# Patient Record
Sex: Male | Born: 1980
Health system: Southern US, Community
[De-identification: ages and names within clinical notes are randomized; demographics above are authoritative.]

## PROBLEM LIST (undated history)

## (undated) DIAGNOSIS — D4989 Neoplasm of unspecified behavior of other specified sites: Principal | ICD-10-CM

## (undated) DIAGNOSIS — C8590 Non-Hodgkin lymphoma, unspecified, unspecified site: Secondary | ICD-10-CM

## (undated) DIAGNOSIS — R0600 Dyspnea, unspecified: Secondary | ICD-10-CM

## (undated) DIAGNOSIS — J984 Other disorders of lung: Secondary | ICD-10-CM

## (undated) DIAGNOSIS — Z86718 Personal history of other venous thrombosis and embolism: Secondary | ICD-10-CM

## (undated) DIAGNOSIS — J189 Pneumonia, unspecified organism: Secondary | ICD-10-CM

## (undated) DIAGNOSIS — C8330 Diffuse large B-cell lymphoma, unspecified site: Secondary | ICD-10-CM

## (undated) DIAGNOSIS — I82409 Acute embolism and thrombosis of unspecified deep veins of unspecified lower extremity: Secondary | ICD-10-CM

## (undated) DIAGNOSIS — T7840XA Allergy, unspecified, initial encounter: Secondary | ICD-10-CM

## (undated) DIAGNOSIS — C801 Malignant (primary) neoplasm, unspecified: Secondary | ICD-10-CM

## (undated) HISTORY — DX: Pneumonia, unspecified organism: J18.9

## (undated) HISTORY — PX: WISDOM TOOTH EXTRACTION: SHX21

## (undated) HISTORY — DX: Diffuse large B-cell lymphoma, unspecified site: C83.30

## (undated) HISTORY — PX: TYMPANIC MEMBRANE REPAIR: SHX294

## (undated) HISTORY — PX: OTHER SURGICAL HISTORY: SHX169

## (undated) HISTORY — DX: Dyspnea, unspecified: R06.00

## (undated) HISTORY — DX: Other disorders of lung: J98.4

## (undated) HISTORY — DX: Malignant (primary) neoplasm, unspecified: C80.1

## (undated) HISTORY — DX: Acute embolism and thrombosis of unspecified deep veins of unspecified lower extremity: I82.409

## (undated) HISTORY — DX: Neoplasm of unspecified behavior of other specified sites: D49.89

## (undated) HISTORY — DX: Non-Hodgkin lymphoma, unspecified, unspecified site: C85.90

## (undated) HISTORY — DX: Personal history of other venous thrombosis and embolism: Z86.718

---

## 2008-05-12 ENCOUNTER — Emergency Department (HOSPITAL_COMMUNITY): Admission: EM | Admit: 2008-05-12 | Discharge: 2008-05-12 | Payer: Self-pay | Admitting: Emergency Medicine

## 2008-05-13 ENCOUNTER — Ambulatory Visit (HOSPITAL_COMMUNITY): Admission: RE | Admit: 2008-05-13 | Discharge: 2008-05-13 | Payer: Self-pay | Admitting: Pulmonary Disease

## 2008-05-15 ENCOUNTER — Ambulatory Visit: Payer: Self-pay | Admitting: Cardiology

## 2008-05-16 ENCOUNTER — Emergency Department (HOSPITAL_COMMUNITY): Admission: EM | Admit: 2008-05-16 | Discharge: 2008-05-17 | Payer: Self-pay | Admitting: Emergency Medicine

## 2008-05-16 ENCOUNTER — Ambulatory Visit (HOSPITAL_COMMUNITY): Admission: RE | Admit: 2008-05-16 | Discharge: 2008-05-16 | Payer: Self-pay | Admitting: Pulmonary Disease

## 2008-05-31 ENCOUNTER — Ambulatory Visit: Payer: Self-pay | Admitting: Cardiology

## 2008-10-15 DIAGNOSIS — R0600 Dyspnea, unspecified: Secondary | ICD-10-CM | POA: Insufficient documentation

## 2008-10-15 DIAGNOSIS — R002 Palpitations: Secondary | ICD-10-CM | POA: Insufficient documentation

## 2008-10-15 DIAGNOSIS — R0789 Other chest pain: Secondary | ICD-10-CM | POA: Insufficient documentation

## 2008-10-15 DIAGNOSIS — R0602 Shortness of breath: Secondary | ICD-10-CM | POA: Insufficient documentation

## 2010-06-18 LAB — POCT CARDIAC MARKERS
CKMB, poc: <1 ng/mL — ABNORMAL LOW (ref 1.0–8.0)
CKMB, poc: <1 ng/mL — ABNORMAL LOW (ref 1.0–8.0)
Myoglobin, poc: 51.1 ng/mL (ref 12–200)
Myoglobin, poc: 60.7 ng/mL (ref 12–200)
Troponin i, poc: <0.05 ng/mL (ref 0.00–0.09)
Troponin i, poc: <0.05 ng/mL (ref 0.00–0.09)

## 2010-07-21 NOTE — Procedures (Signed)
NAME:  [name], CODE                ACCOUNT NO.:  [id number]   MEDICAL RECORD NO.:  [id number]          PATIENT TYPE:  OUT   LOCATION:  RESP                          FACILITY:  [facility]   PHYSICIAN:  Edward L. Juanetta Gosling, M.D.DATE OF BIRTH:  Oct 18, 1980   DATE OF PROCEDURE:  DATE OF DISCHARGE:                            PULMONARY FUNCTION TEST   1. Spirometry is normal.  2. Lung volumes are normal.  3. DLCO was normal.      Edward L. Juanetta Gosling, M.D.  Electronically Signed     ELH/MEDQ  D:  05/18/2008  T:  05/18/2008  Job:  [id number]

## 2010-07-21 NOTE — Letter (Signed)
May 15, 2008    Edward L. Juanetta Gosling, MD  [address]  [place], [place] [address]   RE:  [name], [name]  MRN:  [id number]  /  DOB:  08-25-1980   Dr. Renae Fickle:   It was my pleasure evaluating [name] in the office today in  consultation at your request for episodic chest discomfort, dyspnea, and  palpitations.  As you know, this nice young gentleman has enjoyed  excellent health.  He has no known risk factors for cardiovascular  disease, certainly not for premature disease.  He has been active and  enjoyed sports without any symptoms or difficulty.  He recently had  drainage from his ear and sinus congestion, prompting him to take a  course of azithromycin and decongestant.  He subsequently had pain in  his ear, prompting a second course of antibiotics, at this time with  Levaquin.  The night that he developed symptoms, he had been watching  basketball game, drinking a few beers, and took Advil, in addition to  his Levaquin.  He subsequently awoke with a sensation of tachy  palpitations.  He walked to the bathroom and experienced some  lightheadedness, but no falling and no syncope.  He had a dry mouth and  drink considerable amount of water.  He returned to bed, but experienced  some dyspnea.  This does not sound like true air hunger, but rather a  feeling that he is not taking a deep breath or not able to take a full  breath.  He walked around some at home ,but had continued symptoms as  well as some transient chest heaviness prompting him to seek evaluation  in the emergency department.  A chest x-ray was unremarkable.  Cardiac  markers were unremarkable.  EKG was reportedly unremarkable.  Those  records were reviewed.  The impression was adverse drug reaction, and no  treatment was offered.  He subsequently was seen in your office the  following day at which time there were no new findings.  It was felt  that he did not require any additional medication for his presumed  otitis.  That  night, he had a second spell similar to that described  initially.  Subsequent to that, he has felt somewhat washed out and has  had discontinuing sensation of respiratory difficulty.  He has no  further sinus congestion.  He has had no recent drainage from his ear.  He has had no fever nor chills.   He denies any drugs or over-the-counter preparations.  He had used a  protein preparation in the past, but not within recent days.  He has not  had any recent travel nor any recent immobility for any period of time.   He takes no medications routinely.   He has no history of drug allergies.   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  Exercises regularly.   FAMILY HISTORY:  Father, mother and 2 siblings are alive and well.  His  grandfather does have cardiac disease.   REVIEW OF SYSTEMS:  Notable for stable weight, appetite, and a normal  diet.  All other systems reviewed and are negative.   PHYSICAL EXAMINATION:  GENERAL:  Well-developed gentleman in no acute  distress.  VITAL SIGNS:  The weight is 198.  Blood pressure 125/75, heart rate is  90 and regular, respirations 13, and unlabored.  HEENT:  Anicteric sclerae; EOMs full; normal oral mucosa.  NECK:  No jugular venous distention; no carotid bruits.  ENDOCRINE:  No thyromegaly.  HEMATOPOIETIC:  No adenopathy.  SKIN:  No significant lesions.  CARDIAC:  Normal first and second heart sounds.  LUNGS:  Clear with full expansion; resonant to percussion.  ABDOMEN:  Soft and nontender; no organomegaly.  EXTREMITIES:  No edema; normal distal pulses.  NEUROLOGIC:  Symmetric strength and tone; normal cranial nerves.   A 6-minute walk test was performed.  The patient covered 1400 feet  without difficulty or symptoms; oxygen saturation remained greater than  or equal to 96% throughout.   IMPRESSION:  [name] has predominately palpitations with some  associated disorder  breathing, chest discomfort, lightheadedness, and an  episode of emesis during the initial spell.  This does not lead me to  any specific diagnosis.  Certainly, basic blood work is necessary  including a TSH level, chemistry profile, and CBC.  A D-dimer and BMP  level will also be obtained.  He does not need another chest x-ray.  His  EKG was reviewed and shows a right bundle-branch block without any other  abnormalities.  This is likely benign and does not reflect  underlying cardiac disease.  If he experiences any additional episodes  of palpitations, he will be provided with an event recorder.  If there  are any other suggestions of abnormality, further testing including  stress testing and an echocardiogram will be necessary.  I will plan to  reevaluate this nice gentleman in 1-2 weeks.    Sincerely,      Gerrit Friends. Dietrich Pates, MD, [facility]  Electronically Signed    RMR/MedQ  DD: 05/15/2008  DT: 05/16/2008  Job #: [id number]

## 2010-07-21 NOTE — Letter (Signed)
May 31, 2008    Edward L. Juanetta Gosling, MD  [address]  [place], [place] [address]   RE:  DELMON, ANDRADA  MRN:  [id number]  /  DOB:  January 26, 1981   Dear Renae Fickle,   Mr. Cannata returns to the office for continued assessment and treatment  of chest discomfort, dyspnea, and palpitations.  As you know, he was  seen in the emergency department approximately 2 weeks ago with a  recurrence of these symptoms.  No specific diagnosis was established.  He has had a normal CBC, normal chemistry profile, normal lipid profile,  and normal TSH level.  His BMP and D-dimer were also normal.  Chest x-  ray and pulmonary function tests were normal.  He was given a  prescription for atenolol, but was fearful of taking it and has not yet  had the prescription filled.  He was given prescription for low-dose  benzodiazepine from you.  This has worked well during the few recurrent  spells that he has experienced.  Over the past 10 days or so, he has  felt fine.   PHYSICAL EXAMINATION:  GENERAL:  Well-developed, slightly overweight  gentleman in no acute distress.  VITAL SIGNS:  The weight is 207, 9 pounds more than at his last visit.  Blood pressure 125/70, heart rate 90 and regular, respirations 14 and  unlabored.  NECK:  No jugular venous distention; no carotid bruits.  LUNGS:  Clear.  CARDIAC:  Normal first and second heart sounds.   Mr. Sagan observed his monitor in the emergency department while he was  experiencing symptoms.  The monitor reported a heart rate of 80.  There  was no report on the emergency department record of any documented  arrhythmias.   IMPRESSION:  Mr. Sookdeo has symptoms, which have improved with p.r.n.  Xanax.  He denies any anxiety or any particular life stresses at this  time.  He did have a transient cough that has now resolved - perhaps he  experienced a viral syndrome resulting in the symptoms.  Arrhythmias can  be very variable overtime so that symptoms can come and go.  He  thinks  that his problems are probably not related to his heart, but this  remains a possibility.   At this point, no further evaluation is warranted.  I reassured him that  he could take atenolol on a p.r.n. basis if he experiences recurrent  symptoms.  While I do not think he has anything pathological that  requires treatment, it would be interesting to see whether his symptoms  are improved by beta-blocker.  I have asked Mr. Wender to call me should  he have any additional episodes.  Otherwise, I will plan not to see him  routinely.  Thank you so much for sending him for my evaluation.     Sincerely,      Gerrit Friends. Dietrich Pates, MD, [facility]  Electronically Signed    RMR/MedQ  DD: 05/31/2008  DT: 06/01/2008  Job #: [id number]

## 2010-10-12 ENCOUNTER — Encounter: Payer: Self-pay | Admitting: *Deleted

## 2010-10-12 ENCOUNTER — Emergency Department (HOSPITAL_COMMUNITY)
Admission: EM | Admit: 2010-10-12 | Discharge: 2010-10-12 | Disposition: A | Discharge status: Home / Self Care | Payer: BC Managed Care – PPO | Attending: Emergency Medicine | Admitting: Emergency Medicine

## 2010-10-12 ENCOUNTER — Emergency Department (HOSPITAL_COMMUNITY): Payer: BC Managed Care – PPO

## 2010-10-12 DIAGNOSIS — R222 Localized swelling, mass and lump, trunk: Secondary | ICD-10-CM | POA: Insufficient documentation

## 2010-10-12 DIAGNOSIS — J9859 Other diseases of mediastinum, not elsewhere classified: Secondary | ICD-10-CM

## 2010-10-12 DIAGNOSIS — D4989 Neoplasm of unspecified behavior of other specified sites: Secondary | ICD-10-CM

## 2010-10-12 HISTORY — DX: Neoplasm of unspecified behavior of other specified sites: D49.89

## 2010-10-12 LAB — BASIC METABOLIC PANEL
BUN: 16 mg/dL (ref 6–23)
CO2: 23 mEq/L (ref 19–32)
Calcium: 9.8 mg/dL (ref 8.4–10.5)
Chloride: 102 mEq/L (ref 96–112)
Creatinine, Ser: 0.87 mg/dL (ref 0.50–1.35)
GFR calc Af Amer: >60 mL/min (ref >60)
GFR calc non Af Amer: >60 mL/min (ref >60)
Glucose, Bld: 115 mg/dL — ABNORMAL HIGH (ref 70–99)
Potassium: 3.5 mEq/L (ref 3.5–5.1)
Sodium: 140 mEq/L (ref 135–145)

## 2010-10-12 LAB — CBC
HCT: 45.7 % (ref 39.0–52.0)
Hemoglobin: 16.1 g/dL (ref 13.0–17.0)
MCH: 31.3 pg (ref 26.0–34.0)
MCHC: 35.2 g/dL (ref 30.0–36.0)
MCV: 88.7 fL (ref 78.0–100.0)
Platelets: 282 10*3/uL (ref 150–400)
RBC: 5.15 MIL/uL (ref 4.22–5.81)
RDW: 12.3 % (ref 11.5–15.5)
WBC: 8 10*3/uL (ref 4.0–10.5)

## 2010-10-12 LAB — PROTIME-INR
INR: 1.05 (ref 0.00–1.49)
Prothrombin Time: 13.9 seconds (ref 11.6–15.2)

## 2010-10-12 LAB — APTT: aPTT: 31 seconds (ref 24–37)

## 2010-10-12 LAB — DIFFERENTIAL
Basophils Absolute: 0 10*3/uL (ref 0.0–0.1)
Basophils Relative: 1 % (ref 0–1)
Eosinophils Absolute: 0.2 10*3/uL (ref 0.0–0.7)
Eosinophils Relative: 3 % (ref 0–5)
Lymphocytes Relative: 22 % (ref 12–46)
Lymphs Abs: 1.7 10*3/uL (ref 0.7–4.0)
Monocytes Absolute: 0.8 10*3/uL (ref 0.1–1.0)
Monocytes Relative: 10 % (ref 3–12)
Neutro Abs: 5.2 10*3/uL (ref 1.7–7.7)
Neutrophils Relative %: 65 % (ref 43–77)

## 2010-10-12 MED ORDER — SODIUM CHLORIDE 0.9 % IV SOLN
Freq: Once | INTRAVENOUS | Status: AC
  Administered 2010-10-12: 04:00:00 via INTRAVENOUS

## 2010-10-12 MED ORDER — GUAIFENESIN-CODEINE 100-10 MG/5ML PO SOLN
5.0000 mL | Freq: Once | ORAL | Status: AC
  Administered 2010-10-12: 5 mL via ORAL

## 2010-10-12 MED ORDER — IOHEXOL 300 MG/ML  SOLN
100.0000 mL | Freq: Once | INTRAMUSCULAR | Status: AC | PRN
  Administered 2010-10-12: 100 mL via INTRAVENOUS

## 2010-10-12 MED ORDER — GUAIFENESIN-CODEINE 100-10 MG/5ML PO SOLN
10.0000 mL | Freq: Once | ORAL | Status: DC
  Filled 2010-10-12 (×2): qty 5

## 2010-10-12 NOTE — ED Notes (Signed)
Family at bedside. 

## 2010-10-12 NOTE — ED Notes (Signed)
Pt reports increasing cough and sob x 2 days, pt reports clear sputum

## 2010-10-12 NOTE — ED Provider Notes (Signed)
History     CSN: [id number] Arrival date & time: 10/12/2010  2:15 AM  Chief Complaint  Patient presents with  . Shortness of Breath   Patient is a 30 y.o. male presenting with cough. The history is provided by the patient.  Cough This is a new problem. Episode onset: cough for several days. using mucinex. The problem occurs every few minutes. The problem has been gradually worsening. The cough is non-productive. There has been no fever. Associated symptoms include shortness of breath. Associated symptoms comments: Tonight was driving and had a coughing spell. Felt short of breath. This is the first time he has felt short of breath. He came directly to the hospital.. He has tried decongestants for the symptoms. The treatment provided no relief. 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  He is not a smoker.    History reviewed. No pertinent past medical history.  History reviewed. No pertinent past surgical history.  No family history on file.  History  Substance Use Topics  . Smoking status: Never Smoker   . Smokeless tobacco: Not on file  . Alcohol Use: Yes      Review of Systems  Respiratory: Positive for cough and shortness of breath.   All other systems reviewed and are negative.    Physical Exam  BP 129/84  Pulse 94  Temp(Src) 98.3 F (36.8 C) (Oral)  Resp 22  Ht 6' (1.829 m)  Wt 205 lb (92.987 kg)  BMI 27.80 kg/m2  SpO2 96%  Physical Exam  Constitutional: He is oriented to person, place, and time. He appears well-developed and well-nourished. No distress.  HENT:  Head: Normocephalic.  Nose: Nose normal.  Mouth/Throat: Oropharynx is clear and moist.  Eyes: EOM are normal. Pupils are equal, round, and reactive to light.  Neck: Normal range of motion. Neck supple. No thyromegaly present.  Cardiovascular: Normal rate, normal heart sounds and intact distal pulses.   Pulmonary/Chest: Effort normal.       Patient with a dry coarse  cough, no rales, rhonchi or wheezing noted.  Abdominal: Soft.  Musculoskeletal: Normal range of motion.  Neurological: He is alert and oriented to person, place, and time.  Skin: Skin is warm and dry.    ED Course  Procedures  MDM Chest xray showed a mediastinal mass. Labs were normal. CT chest confirms a large 8x8x8 cm mass. Reviewed results with patient and his mother. Showed them both the xray and CT. They will see their PMD Dr. Juanetta Gosling this morning for referral to surgery for biopsy.No treatment begun in the ER.  MDM Reviewed: nursing note and vitals Reviewed previous: labs, CT scan and x-ray Interpretation: labs Total time providing critical care: 30-74 minutes. This excludes time spent performing separately reportable procedures and services.        Nicoletta Dress. Colon Branch, MD 10/12/10 [id number]

## 2010-10-12 NOTE — ED Notes (Signed)
Transported to xray 

## 2010-10-12 NOTE — ED Notes (Signed)
Patient is resting comfortably. 

## 2010-10-21 ENCOUNTER — Encounter: Payer: BC Managed Care – PPO | Admitting: Thoracic Surgery

## 2010-11-01 ENCOUNTER — Emergency Department (HOSPITAL_COMMUNITY)
Admission: EM | Admit: 2010-11-01 | Discharge: 2010-11-01 | Disposition: A | Discharge status: Home / Self Care | Payer: BC Managed Care – PPO | Attending: Emergency Medicine | Admitting: Emergency Medicine

## 2010-11-01 ENCOUNTER — Emergency Department (HOSPITAL_COMMUNITY): Payer: BC Managed Care – PPO

## 2010-11-01 DIAGNOSIS — I498 Other specified cardiac arrhythmias: Secondary | ICD-10-CM | POA: Insufficient documentation

## 2010-11-01 DIAGNOSIS — R072 Precordial pain: Secondary | ICD-10-CM | POA: Insufficient documentation

## 2010-11-01 LAB — POCT I-STAT TROPONIN I: Troponin i, poc: 0 ng/mL (ref 0.00–0.08)

## 2010-11-01 LAB — COMPREHENSIVE METABOLIC PANEL
ALT: 81 U/L — ABNORMAL HIGH (ref 0–53)
AST: 39 U/L — ABNORMAL HIGH (ref 0–37)
Albumin: 4.6 g/dL (ref 3.5–5.2)
Alkaline Phosphatase: 79 U/L (ref 39–117)
BUN: 21 mg/dL (ref 6–23)
CO2: 27 mEq/L (ref 19–32)
Calcium: 9.8 mg/dL (ref 8.4–10.5)
Chloride: 98 mEq/L (ref 96–112)
Creatinine, Ser: 1.02 mg/dL (ref 0.50–1.35)
GFR calc Af Amer: >60 mL/min (ref >60)
GFR calc non Af Amer: >60 mL/min (ref >60)
Glucose, Bld: 97 mg/dL (ref 70–99)
Potassium: 3.6 mEq/L (ref 3.5–5.1)
Sodium: 137 mEq/L (ref 135–145)
Total Bilirubin: 0.4 mg/dL (ref 0.3–1.2)
Total Protein: 7.6 g/dL (ref 6.0–8.3)

## 2010-11-01 LAB — CBC
HCT: 47.7 % (ref 39.0–52.0)
Hemoglobin: 17.3 g/dL — ABNORMAL HIGH (ref 13.0–17.0)
MCH: 32.2 pg (ref 26.0–34.0)
MCHC: 36.3 g/dL — ABNORMAL HIGH (ref 30.0–36.0)
MCV: 88.8 fL (ref 78.0–100.0)
Platelets: 281 10*3/uL (ref 150–400)
RBC: 5.37 MIL/uL (ref 4.22–5.81)
RDW: 12.8 % (ref 11.5–15.5)
WBC: 10.4 10*3/uL (ref 4.0–10.5)

## 2010-11-02 DIAGNOSIS — D4989 Neoplasm of unspecified behavior of other specified sites: Secondary | ICD-10-CM

## 2010-11-03 ENCOUNTER — Encounter: Payer: Self-pay | Admitting: Thoracic Surgery

## 2010-11-03 ENCOUNTER — Other Ambulatory Visit: Payer: Self-pay | Admitting: Thoracic Surgery

## 2010-11-03 ENCOUNTER — Ambulatory Visit (INDEPENDENT_AMBULATORY_CARE_PROVIDER_SITE_OTHER): Payer: BC Managed Care – PPO | Admitting: Thoracic Surgery

## 2010-11-03 VITALS — BP 136/76 | HR 123 | Temp 98.4°F | Resp 18 | Ht 72.0 in | Wt 205.0 lb

## 2010-11-03 DIAGNOSIS — R222 Localized swelling, mass and lump, trunk: Secondary | ICD-10-CM

## 2010-11-03 DIAGNOSIS — J9859 Other diseases of mediastinum, not elsewhere classified: Secondary | ICD-10-CM

## 2010-11-03 NOTE — Progress Notes (Signed)
HPI The patient is a 30 year old Caucasian man who went to [facility] for trouble with breathing. He has a history of asthmatic bronchitis. Chest x-ray showed a mediastinum mass. CT scan of the chest was ordered which showed an 8 x 8 cm left mediastinal mass in the left lobe of the thymus gland. He is referred for evaluation. He has had no night sweats no weight loss and some dyspnea. He has not had no chest pain or hemoptysis. The differential diagnosis includes teratoma, thymoma, lymphoma or germ cell tumor. I will schedule pulmonary function test and a PET scan. I will do a mediastinotomy on August31 at [facility].   Current Outpatient Prescriptions  Medication Sig Dispense Refill  . fexofenadine (ALLEGRA) 180 MG tablet Take 180 mg by mouth daily.        Marland Kitchen HYDROcodone-homatropine (HYCODAN) 5-1.5 MG/5ML syrup Take by mouth every 6 (six) hours as needed.        . predniSONE (STERAPRED UNI-PAK) 10 MG tablet Take 10 mg by mouth daily.            Physical Exam  Constitutional: He is oriented to person, place, and time. He appears well-developed and well-nourished.  HENT:  Head: Normocephalic.  Right Ear: External ear normal.  Nose: Nose normal.  Mouth/Throat: Oropharynx is clear and moist.  Eyes: Pupils are equal, round, and reactive to light.  Neck: Normal range of motion. Neck supple. No tracheal deviation present. No thyromegaly present.  Cardiovascular: Normal rate and regular rhythm.   Pulmonary/Chest: Effort normal and breath sounds normal. He exhibits no tenderness.  Abdominal: Soft. There is no tenderness.  Musculoskeletal: Normal range of motion.  Lymphadenopathy:    He has no cervical adenopathy.  Neurological: He is alert and oriented to person, place, and time.  Skin: Skin is warm.     Diagnostic tests: Pulmonary function test. PET scan.  Impression: Mediastinal mass rule out lymphoma thymoma germ cell tumor or a teratoma. Plan: See history of present  illness.

## 2010-11-05 ENCOUNTER — Ambulatory Visit (HOSPITAL_COMMUNITY)
Admission: RE | Admit: 2010-11-05 | Discharge: 2010-11-05 | Disposition: A | Discharge status: Home / Self Care | Payer: BC Managed Care – PPO | Source: Ambulatory Visit | Attending: Thoracic Surgery | Admitting: Thoracic Surgery

## 2010-11-05 ENCOUNTER — Other Ambulatory Visit: Payer: Self-pay | Admitting: Thoracic Surgery

## 2010-11-05 ENCOUNTER — Encounter (HOSPITAL_COMMUNITY)
Admission: RE | Admit: 2010-11-05 | Discharge: 2010-11-05 | Disposition: A | Discharge status: Home / Self Care | Payer: BC Managed Care – PPO | Source: Ambulatory Visit | Attending: Thoracic Surgery | Admitting: Thoracic Surgery

## 2010-11-05 DIAGNOSIS — Z01812 Encounter for preprocedural laboratory examination: Secondary | ICD-10-CM | POA: Insufficient documentation

## 2010-11-05 DIAGNOSIS — Z01818 Encounter for other preprocedural examination: Secondary | ICD-10-CM | POA: Insufficient documentation

## 2010-11-05 DIAGNOSIS — R918 Other nonspecific abnormal finding of lung field: Secondary | ICD-10-CM

## 2010-11-05 DIAGNOSIS — Z01810 Encounter for preprocedural cardiovascular examination: Secondary | ICD-10-CM | POA: Insufficient documentation

## 2010-11-05 DIAGNOSIS — J984 Other disorders of lung: Secondary | ICD-10-CM | POA: Insufficient documentation

## 2010-11-05 LAB — SURGICAL PCR SCREEN
MRSA, PCR: NEGATIVE
Staphylococcus aureus: NEGATIVE

## 2010-11-05 LAB — CBC
HCT: 47.1 % (ref 39.0–52.0)
Hemoglobin: 16.6 g/dL (ref 13.0–17.0)
MCH: 31.6 pg (ref 26.0–34.0)
MCHC: 35.2 g/dL (ref 30.0–36.0)
MCV: 89.5 fL (ref 78.0–100.0)
Platelets: 318 10*3/uL (ref 150–400)
RBC: 5.26 MIL/uL (ref 4.22–5.81)
RDW: 12.8 % (ref 11.5–15.5)
WBC: 8 10*3/uL (ref 4.0–10.5)

## 2010-11-05 LAB — COMPREHENSIVE METABOLIC PANEL
ALT: 67 U/L — ABNORMAL HIGH (ref 0–53)
AST: 33 U/L (ref 0–37)
Albumin: 4.1 g/dL (ref 3.5–5.2)
Alkaline Phosphatase: 79 U/L (ref 39–117)
BUN: 18 mg/dL (ref 6–23)
CO2: 29 mEq/L (ref 19–32)
Calcium: 10.1 mg/dL (ref 8.4–10.5)
Chloride: 99 mEq/L (ref 96–112)
Creatinine, Ser: 0.98 mg/dL (ref 0.50–1.35)
GFR calc Af Amer: >60 mL/min (ref >60)
GFR calc non Af Amer: >60 mL/min (ref >60)
Glucose, Bld: 105 mg/dL — ABNORMAL HIGH (ref 70–99)
Potassium: 4.3 mEq/L (ref 3.5–5.1)
Sodium: 139 mEq/L (ref 135–145)
Total Bilirubin: 0.6 mg/dL (ref 0.3–1.2)
Total Protein: 7.1 g/dL (ref 6.0–8.3)

## 2010-11-05 LAB — PROTIME-INR
INR: 1.03 (ref 0.00–1.49)
Prothrombin Time: 13.7 seconds (ref 11.6–15.2)

## 2010-11-05 LAB — TYPE AND SCREEN
ABO/RH(D): O POS
Antibody Screen: NEGATIVE

## 2010-11-05 LAB — APTT: aPTT: 29 seconds (ref 24–37)

## 2010-11-05 LAB — ABO/RH: ABO/RH(D): O POS

## 2010-11-06 ENCOUNTER — Other Ambulatory Visit: Payer: Self-pay | Admitting: Thoracic Surgery

## 2010-11-06 ENCOUNTER — Inpatient Hospital Stay (HOSPITAL_COMMUNITY)
Admission: RE | Admit: 2010-11-06 | Discharge: 2010-11-09 | DRG: 402 | Disposition: A | Discharge status: Home / Self Care | Payer: BC Managed Care – PPO | Source: Ambulatory Visit | Attending: Thoracic Surgery | Admitting: Thoracic Surgery

## 2010-11-06 ENCOUNTER — Ambulatory Visit (HOSPITAL_COMMUNITY): Payer: BC Managed Care – PPO

## 2010-11-06 DIAGNOSIS — Z01812 Encounter for preprocedural laboratory examination: Secondary | ICD-10-CM

## 2010-11-06 DIAGNOSIS — I498 Other specified cardiac arrhythmias: Secondary | ICD-10-CM | POA: Diagnosis not present

## 2010-11-06 DIAGNOSIS — Z01810 Encounter for preprocedural cardiovascular examination: Secondary | ICD-10-CM

## 2010-11-06 DIAGNOSIS — R222 Localized swelling, mass and lump, trunk: Secondary | ICD-10-CM

## 2010-11-06 DIAGNOSIS — Z79899 Other long term (current) drug therapy: Secondary | ICD-10-CM

## 2010-11-06 DIAGNOSIS — R5082 Postprocedural fever: Secondary | ICD-10-CM | POA: Diagnosis not present

## 2010-11-06 DIAGNOSIS — J45909 Unspecified asthma, uncomplicated: Secondary | ICD-10-CM | POA: Diagnosis present

## 2010-11-06 DIAGNOSIS — C8582 Other specified types of non-Hodgkin lymphoma, intrathoracic lymph nodes: Principal | ICD-10-CM | POA: Diagnosis present

## 2010-11-06 DIAGNOSIS — Z01818 Encounter for other preprocedural examination: Secondary | ICD-10-CM

## 2010-11-06 DIAGNOSIS — C801 Malignant (primary) neoplasm, unspecified: Secondary | ICD-10-CM

## 2010-11-06 HISTORY — DX: Malignant (primary) neoplasm, unspecified: C80.1

## 2010-11-06 HISTORY — PX: MEDIASTINOTOMY: SHX1011

## 2010-11-07 ENCOUNTER — Ambulatory Visit (HOSPITAL_COMMUNITY): Payer: BC Managed Care – PPO

## 2010-11-07 DIAGNOSIS — I82409 Acute embolism and thrombosis of unspecified deep veins of unspecified lower extremity: Secondary | ICD-10-CM

## 2010-11-07 DIAGNOSIS — Z86718 Personal history of other venous thrombosis and embolism: Secondary | ICD-10-CM

## 2010-11-07 HISTORY — DX: Acute embolism and thrombosis of unspecified deep veins of unspecified lower extremity: I82.409

## 2010-11-07 HISTORY — PX: PORTACATH PLACEMENT: SHX2246

## 2010-11-07 HISTORY — DX: Personal history of other venous thrombosis and embolism: Z86.718

## 2010-11-07 LAB — CBC
HCT: 47.1 % (ref 39.0–52.0)
Hemoglobin: 16.2 g/dL (ref 13.0–17.0)
MCH: 31.2 pg (ref 26.0–34.0)
MCHC: 34.4 g/dL (ref 30.0–36.0)
MCV: 90.8 fL (ref 78.0–100.0)
Platelets: 323 10*3/uL (ref 150–400)
RBC: 5.19 MIL/uL (ref 4.22–5.81)
RDW: 13.1 % (ref 11.5–15.5)
WBC: 15.6 10*3/uL — ABNORMAL HIGH (ref 4.0–10.5)

## 2010-11-07 LAB — BASIC METABOLIC PANEL
BUN: 15 mg/dL (ref 6–23)
CO2: 28 mEq/L (ref 19–32)
Calcium: 9.4 mg/dL (ref 8.4–10.5)
Chloride: 95 mEq/L — ABNORMAL LOW (ref 96–112)
Creatinine, Ser: 1.03 mg/dL (ref 0.50–1.35)
GFR calc Af Amer: >60 mL/min (ref >60)
GFR calc non Af Amer: >60 mL/min (ref >60)
Glucose, Bld: 138 mg/dL — ABNORMAL HIGH (ref 70–99)
Potassium: 3.6 mEq/L (ref 3.5–5.1)
Sodium: 133 mEq/L — ABNORMAL LOW (ref 135–145)

## 2010-11-08 ENCOUNTER — Ambulatory Visit (HOSPITAL_COMMUNITY): Payer: BC Managed Care – PPO

## 2010-11-08 LAB — CBC
HCT: 43 % (ref 39.0–52.0)
Hemoglobin: 15.6 g/dL (ref 13.0–17.0)
MCH: 32.1 pg (ref 26.0–34.0)
MCHC: 36.3 g/dL — ABNORMAL HIGH (ref 30.0–36.0)
MCV: 88.5 fL (ref 78.0–100.0)
Platelets: 271 10*3/uL (ref 150–400)
RBC: 4.86 MIL/uL (ref 4.22–5.81)
RDW: 12.7 % (ref 11.5–15.5)
WBC: 15.1 10*3/uL — ABNORMAL HIGH (ref 4.0–10.5)

## 2010-11-08 LAB — COMPREHENSIVE METABOLIC PANEL
ALT: 29 U/L (ref 0–53)
AST: 25 U/L (ref 0–37)
Albumin: 3.3 g/dL — ABNORMAL LOW (ref 3.5–5.2)
Alkaline Phosphatase: 65 U/L (ref 39–117)
BUN: 9 mg/dL (ref 6–23)
CO2: 27 mEq/L (ref 19–32)
Calcium: 9.3 mg/dL (ref 8.4–10.5)
Chloride: 95 mEq/L — ABNORMAL LOW (ref 96–112)
Creatinine, Ser: 0.72 mg/dL (ref 0.50–1.35)
GFR calc Af Amer: >60 mL/min (ref >60)
GFR calc non Af Amer: >60 mL/min (ref >60)
Glucose, Bld: 128 mg/dL — ABNORMAL HIGH (ref 70–99)
Potassium: 3.6 mEq/L (ref 3.5–5.1)
Sodium: 131 mEq/L — ABNORMAL LOW (ref 135–145)
Total Bilirubin: 0.7 mg/dL (ref 0.3–1.2)
Total Protein: 6.7 g/dL (ref 6.0–8.3)

## 2010-11-09 ENCOUNTER — Ambulatory Visit (HOSPITAL_COMMUNITY): Payer: BC Managed Care – PPO

## 2010-11-09 LAB — CBC
HCT: 39.5 % (ref 39.0–52.0)
Hemoglobin: 14.2 g/dL (ref 13.0–17.0)
MCH: 31.7 pg (ref 26.0–34.0)
MCHC: 35.9 g/dL (ref 30.0–36.0)
MCV: 88.2 fL (ref 78.0–100.0)
Platelets: 237 10*3/uL (ref 150–400)
RBC: 4.48 MIL/uL (ref 4.22–5.81)
RDW: 12.7 % (ref 11.5–15.5)
WBC: 10.5 10*3/uL (ref 4.0–10.5)

## 2010-11-10 ENCOUNTER — Other Ambulatory Visit: Payer: Self-pay | Admitting: Thoracic Surgery

## 2010-11-10 DIAGNOSIS — R222 Localized swelling, mass and lump, trunk: Secondary | ICD-10-CM

## 2010-11-11 ENCOUNTER — Encounter (HOSPITAL_COMMUNITY)
Admission: RE | Admit: 2010-11-11 | Discharge: 2010-11-11 | Disposition: A | Discharge status: Home / Self Care | Payer: BC Managed Care – PPO | Source: Ambulatory Visit | Attending: Thoracic Surgery | Admitting: Thoracic Surgery

## 2010-11-11 ENCOUNTER — Encounter: Payer: Self-pay | Admitting: Thoracic Surgery

## 2010-11-11 ENCOUNTER — Other Ambulatory Visit: Payer: Self-pay | Admitting: Thoracic Surgery

## 2010-11-11 ENCOUNTER — Ambulatory Visit
Admission: RE | Admit: 2010-11-11 | Discharge: 2010-11-11 | Disposition: A | Discharge status: Home / Self Care | Payer: BC Managed Care – PPO | Source: Ambulatory Visit | Attending: Thoracic Surgery | Admitting: Thoracic Surgery

## 2010-11-11 ENCOUNTER — Ambulatory Visit (HOSPITAL_COMMUNITY)
Admission: RE | Admit: 2010-11-11 | Discharge: 2010-11-11 | Disposition: A | Discharge status: Home / Self Care | Payer: BC Managed Care – PPO | Source: Ambulatory Visit | Attending: Thoracic Surgery | Admitting: Thoracic Surgery

## 2010-11-11 ENCOUNTER — Ambulatory Visit (INDEPENDENT_AMBULATORY_CARE_PROVIDER_SITE_OTHER): Payer: Self-pay | Admitting: Thoracic Surgery

## 2010-11-11 ENCOUNTER — Encounter (HOSPITAL_COMMUNITY): Payer: Self-pay

## 2010-11-11 VITALS — BP 146/84 | HR 130 | Resp 20 | Ht 72.0 in | Wt 205.0 lb

## 2010-11-11 DIAGNOSIS — R222 Localized swelling, mass and lump, trunk: Secondary | ICD-10-CM

## 2010-11-11 DIAGNOSIS — Z79899 Other long term (current) drug therapy: Secondary | ICD-10-CM | POA: Insufficient documentation

## 2010-11-11 DIAGNOSIS — J9859 Other diseases of mediastinum, not elsewhere classified: Secondary | ICD-10-CM

## 2010-11-11 DIAGNOSIS — J90 Pleural effusion, not elsewhere classified: Secondary | ICD-10-CM | POA: Insufficient documentation

## 2010-11-11 DIAGNOSIS — Z09 Encounter for follow-up examination after completed treatment for conditions other than malignant neoplasm: Secondary | ICD-10-CM

## 2010-11-11 DIAGNOSIS — D4989 Neoplasm of unspecified behavior of other specified sites: Secondary | ICD-10-CM

## 2010-11-11 LAB — GLUCOSE, CAPILLARY: Glucose-Capillary: 112 mg/dL — ABNORMAL HIGH (ref 70–99)

## 2010-11-11 MED ORDER — FLUDEOXYGLUCOSE F - 18 (FDG) INJECTION
15.1000 | Freq: Once | INTRAVENOUS | Status: AC | PRN
  Administered 2010-11-11: 15.1 via INTRAVENOUS

## 2010-11-11 NOTE — Progress Notes (Signed)
HPI the patient returns for followup. His biopsy reveals that he has a large set being lymphoma. PET scan done today shows involvement of this of his left anterior mediastinal mass.There is involment with several other nodes in the right Chest. On review of his chest x-rays it appears that he has developed a left phrenic nerve palsy after his CT scan in early August this will explain I. he has a cough and shortness of breath. I have called Dr. Mancel Bale to see initiate treatment. He wanted a Port-A-Cath which I will schedule next week. I explained the risk and benefits of treatment . I will see Dr. Truett Perna tomorrow.  Current Outpatient Prescriptions  Medication Sig Dispense Refill  . acetaminophen (TYLENOL) 650 MG CR tablet Take 650 mg by mouth every 4 (four) hours as needed.        . fexofenadine (ALLEGRA) 180 MG tablet Take 180 mg by mouth daily.        Marland Kitchen guaiFENesin (MUCINEX) 600 MG 12 hr tablet Take 1,200 mg by mouth 2 (two) times daily.        Marland Kitchen HYDROcodone-homatropine (HYCODAN) 5-1.5 MG/5ML syrup Take by mouth every 6 (six) hours as needed.        . montelukast (SINGULAIR) 10 MG tablet Take 10 mg by mouth at bedtime.        . moxifloxacin (AVELOX) 400 MG tablet Take 400 mg by mouth daily. For 5 days       . oxyCODONE-acetaminophen (PERCOCET) 5-325 MG per tablet Take 2 tablets by mouth every 4 (four) hours as needed.         No current facility-administered medications for this visit.   Facility-Administered Medications Ordered in Other Visits  Medication Dose Route Frequency Provider Last Rate Last Dose  . fludeoxyglucose F - 18 (FDG) injection 15.1 milli Curie  15.1 milli Curie Intravenous Once PRN Medication Radiologist   15.1 milli Curie at 11/11/10 0730     Review of Systems: No change   Physical Exam  Cardiovascular: Normal rate and regular rhythm.   Pulmonary/Chest: Effort normal.   decreased breath sounds left lower lobe Diagnostic Tests: Carollee Herter shows and an enlarged  left mediastinal mass left nerve palsy and right peritracheal nodes.  Impression: Large cell B lymphoma  Plan: Dr. Truett Perna for treatment insertion of Port-A-Cath

## 2010-11-12 ENCOUNTER — Other Ambulatory Visit: Payer: Self-pay | Admitting: Oncology

## 2010-11-12 ENCOUNTER — Inpatient Hospital Stay (HOSPITAL_COMMUNITY)
Admission: AD | Admit: 2010-11-12 | Discharge: 2010-11-16 | DRG: 578 | Disposition: A | Discharge status: Home / Self Care | Payer: BC Managed Care – PPO | Source: Ambulatory Visit | Attending: Oncology | Admitting: Oncology

## 2010-11-12 ENCOUNTER — Encounter: Payer: BC Managed Care – PPO | Admitting: Oncology

## 2010-11-12 DIAGNOSIS — R000 Tachycardia, unspecified: Secondary | ICD-10-CM | POA: Diagnosis present

## 2010-11-12 DIAGNOSIS — I82890 Acute embolism and thrombosis of other specified veins: Secondary | ICD-10-CM | POA: Diagnosis present

## 2010-11-12 DIAGNOSIS — R61 Generalized hyperhidrosis: Secondary | ICD-10-CM | POA: Diagnosis present

## 2010-11-12 DIAGNOSIS — R5081 Fever presenting with conditions classified elsewhere: Secondary | ICD-10-CM | POA: Diagnosis present

## 2010-11-12 DIAGNOSIS — R059 Cough, unspecified: Secondary | ICD-10-CM | POA: Diagnosis present

## 2010-11-12 DIAGNOSIS — J189 Pneumonia, unspecified organism: Secondary | ICD-10-CM | POA: Diagnosis present

## 2010-11-12 DIAGNOSIS — C8589 Other specified types of non-Hodgkin lymphoma, extranodal and solid organ sites: Principal | ICD-10-CM | POA: Diagnosis present

## 2010-11-12 DIAGNOSIS — R05 Cough: Secondary | ICD-10-CM | POA: Diagnosis present

## 2010-11-12 DIAGNOSIS — Z5111 Encounter for antineoplastic chemotherapy: Secondary | ICD-10-CM

## 2010-11-12 LAB — CBC WITH DIFFERENTIAL/PLATELET
BASO%: 0.3 % (ref 0.0–2.0)
Basophils Absolute: 0 10*3/uL (ref 0.0–0.1)
EOS%: 1.5 % (ref 0.0–7.0)
Eosinophils Absolute: 0.1 10*3/uL (ref 0.0–0.5)
HCT: 42.7 % (ref 38.4–49.9)
HGB: 14.6 g/dL (ref 13.0–17.1)
LYMPH%: 6.7 % — ABNORMAL LOW (ref 14.0–49.0)
MCH: 31.3 pg (ref 27.2–33.4)
MCHC: 34.3 g/dL (ref 32.0–36.0)
MCV: 91.3 fL (ref 79.3–98.0)
MONO#: 0.7 10*3/uL (ref 0.1–0.9)
MONO%: 7.7 % (ref 0.0–14.0)
NEUT#: 8.1 10*3/uL — ABNORMAL HIGH (ref 1.5–6.5)
NEUT%: 83.8 % — ABNORMAL HIGH (ref 39.0–75.0)
Platelets: 368 10*3/uL (ref 140–400)
RBC: 4.67 10*6/uL (ref 4.20–5.82)
RDW: 13.2 % (ref 11.0–14.6)
WBC: 9.6 10*3/uL (ref 4.0–10.3)
lymph#: 0.6 10*3/uL — ABNORMAL LOW (ref 0.9–3.3)

## 2010-11-13 ENCOUNTER — Inpatient Hospital Stay (HOSPITAL_COMMUNITY): Payer: BC Managed Care – PPO

## 2010-11-13 ENCOUNTER — Other Ambulatory Visit: Payer: Self-pay | Admitting: Interventional Radiology

## 2010-11-13 DIAGNOSIS — M7989 Other specified soft tissue disorders: Secondary | ICD-10-CM

## 2010-11-13 DIAGNOSIS — M79609 Pain in unspecified limb: Secondary | ICD-10-CM

## 2010-11-13 LAB — URIC ACID: Uric Acid, Serum: 4.8 mg/dL (ref 4.0–7.8)

## 2010-11-13 LAB — LACTATE DEHYDROGENASE: LDH: 630 U/L — ABNORMAL HIGH (ref 94–250)

## 2010-11-13 LAB — HEPATITIS B CORE ANTIBODY, TOTAL: Hep B Core Total Ab: NEGATIVE

## 2010-11-13 LAB — COMPREHENSIVE METABOLIC PANEL
ALT: 83 U/L — ABNORMAL HIGH (ref 0–53)
AST: 51 U/L — ABNORMAL HIGH (ref 0–37)
Albumin: 3.8 g/dL (ref 3.5–5.2)
Alkaline Phosphatase: 68 U/L (ref 39–117)
BUN: 16 mg/dL (ref 6–23)
CO2: 24 mEq/L (ref 19–32)
Calcium: 8.9 mg/dL (ref 8.4–10.5)
Chloride: 98 mEq/L (ref 96–112)
Creatinine, Ser: 1.04 mg/dL (ref 0.50–1.35)
Glucose, Bld: 140 mg/dL — ABNORMAL HIGH (ref 70–99)
Potassium: 4 mEq/L (ref 3.5–5.3)
Sodium: 136 mEq/L (ref 135–145)
Total Bilirubin: 0.4 mg/dL (ref 0.3–1.2)
Total Protein: 6.1 g/dL (ref 6.0–8.3)

## 2010-11-13 LAB — HIV ANTIBODY (ROUTINE TESTING W REFLEX): HIV: NONREACTIVE

## 2010-11-13 LAB — HEPATITIS C ANTIBODY: HCV Ab: NEGATIVE

## 2010-11-13 LAB — HEPATITIS B SURFACE ANTIGEN: Hepatitis B Surface Ag: NEGATIVE

## 2010-11-13 NOTE — Op Note (Signed)
  [name], [name] NO.:  [id number]  MEDICAL RECORD NO.:  [id number]  LOCATION:  SDSC                         FACILITY:  [facility]  PHYSICIAN:  Ines Bloomer, M.D. DATE OF BIRTH:  [birth year]  DATE OF PROCEDURE: DATE OF DISCHARGE:                              OPERATIVE REPORT   PREOPERATIVE DIAGNOSIS:  Left anterior mediastinal mass.  POSTOPERATIVE DIAGNOSIS:  Left anterior mediastinal mass, rule out lymphoma.  OPERATION PERFORMED:  Mediastinotomy.  SURGEON:  Ines Bloomer, MD  FIRST ASSISTANT:  Coral Ceo, PA-C  ANESTHESIA:  General anesthesia.  After adequate general anesthesia, he was prepped and draped in usual sterile manner.  A roll was placed under the shoulders to elevate the anterior chest.  He was put in reversed Trendelenburg.  A 2.5 to 3 cm incision was made over the third intercostal space and dissection was carried down through the subcutaneous tissue down to the pectoral muscle.  The pectoral muscle was split and Weitlaner retractor was inserted.  The mass was a large mass just lateral to the mammary, artery, and vein and we did multiple biopsies of this.  Unfortunately, we had to enter the pleura and this was more intrapleural than extrapleural.  For this reason, we sent biopsies for frozen section and revealed malignant tumor, possible lymphoma.  More was taken for permanent section.  Electrocautery was used and made another stab wound lateral in the anterior chest inserted #20 chest tube into the chest.  A tunnel under the pectoral muscles and into the mediastinotomy incision. We then closed the mediastinum.  We sutured in place with 0-silk closing the mediastinotomy incision with interrupted 2-0 Vicryl, 3-0 Vicryl in subcuticular.  This patient returned to recovery room in stable condition.     Ines Bloomer, M.D.     DPB/MEDQ  D:  11/06/2010  T:  11/06/2010  Job:  [id number]  Electronically Signed by Jovita Gamma  M.D. on 11/13/2010 04:32:44 PM

## 2010-11-13 NOTE — Discharge Summary (Signed)
[name], [name] NO.:  [id number]  MEDICAL RECORD NO.:  [id number]  LOCATION:  2020                         FACILITY:  [facility]  PHYSICIAN:  Ines Bloomer, M.D. DATE OF BIRTH:  May 15, 1980  DATE OF ADMISSION:  11/06/2010 DATE OF DISCHARGE:  11/09/2010                              DISCHARGE SUMMARY   HISTORY:  The patient is a 30 year old male who was referred to Dr. Edwyna Shell in consultation for a mediastinal mass.  He presented initially to [facility] due to symptoms of shortness of breath.  He has a history of asthmatic bronchitis.  Chest x-ray has revealed a mediastinal mass.  A CT scan confirmed an 8 x 8 cm left mediastinal mass in the left lobe of the thymus gland.  He was referred for evaluation.  He has had no sweats, no weight loss and some dyspnea.  He has had no chest pain or hemoptysis.  He was felt to require further evaluation to include biopsy by mediastinotomy and he was admitted this hospitalization for the procedure.  MEDICATIONS PRIOR TO ADMISSION: 1. Allegra 180 mg daily. 2. Hycodan syrup q.6 h. p.r.n. 3. Prednisone 10 mg daily by mouth.  PHYSICAL EXAM:  Please see the history and physical.  PAST MEDICAL HISTORY:  None listed.  PAST SURGICAL HISTORY:  None listed.  ALLERGIES:  LEVAQUIN.  HOSPITAL COURSE:  The patient was admitted on November 06, 2010, and underwent mediastinotomy.  This was performed by Dr. Edwyna Shell.  He tolerated the procedure without significant difficulty and was taken to the Post Anesthesia Care Unit in stable condition.  POSTOPERATIVE HOSPITAL COURSE:  Postoperative hospital course has been remarkable for postoperative fever.  This is felt primarily due to atelectasis.  It has slowly improved.  His most recent fever at 2100 on November 08, 2010, was 101.9.  He has been started on Avelox.  He is feeling better.  He also had episode early postoperatively of significant sinus pause of 4 seconds.  This was  observed conservatively. He was also observed and found to have sinus tachycardia at times.  This was felt related to his febrile illness.  He was seen on November 09, 2010, in morning rounds.  His white blood cell count had improved from 15,000 on November 08, 2010, to 10,500 on September 3rd.  His pathology is currently pending.  He was felt by Dr. Edwyna Shell to be stable for discharge at this time.  MEDICATIONS ON DISCHARGE:  Include 1. Tylenol 650 mg every 4 hours p.r.n. 2. Mucinex 600 mg twice daily. 3. Avelox 400 mg daily for an additional 5 days. 4. Percocet 5/325 mg tablets 1-2 every 4 hours p.r.n. 5. Allegra 180 mg daily. 6. Singulair 10 mg daily.  FINAL DIAGNOSIS:  Left-sided mediastinal mass, pathology pending following mediastinotomy for diagnostic purposes.  Other diagnoses include asthmatic bronchitis and history of environmental allergies.  INSTRUCTIONS:  The patient will receive written instructions regarding medications, activity, diet, wound care and followup.  Follow up include Dr. Edwyna Shell on Wednesday of this week with a chest x-ray at that time.     Rowe Clack, P.A.-C.   ______________________________ Ines Bloomer, M.D.  WEG/MEDQ  D:  11/09/2010  T:  11/09/2010  Job:  [id number]  Electronically Signed by Deniece Portela GOLD P.A.-C. on 11/12/2010 12:35:44 PM Electronically Signed by Jovita Gamma M.D. on 11/13/2010 04:32:50 PM

## 2010-11-14 DIAGNOSIS — C8589 Other specified types of non-Hodgkin lymphoma, extranodal and solid organ sites: Secondary | ICD-10-CM

## 2010-11-14 LAB — BASIC METABOLIC PANEL
BUN: 11 mg/dL (ref 6–23)
CO2: 25 mEq/L (ref 19–32)
Calcium: 9.1 mg/dL (ref 8.4–10.5)
Chloride: 97 mEq/L (ref 96–112)
Creatinine, Ser: 0.6 mg/dL (ref 0.50–1.35)
GFR calc Af Amer: >60 mL/min (ref >60)
GFR calc non Af Amer: >60 mL/min (ref >60)
Glucose, Bld: 139 mg/dL — ABNORMAL HIGH (ref 70–99)
Potassium: 3.9 mEq/L (ref 3.5–5.1)
Sodium: 133 mEq/L — ABNORMAL LOW (ref 135–145)

## 2010-11-14 LAB — CBC
HCT: 38.2 % — ABNORMAL LOW (ref 39.0–52.0)
Hemoglobin: 13.3 g/dL (ref 13.0–17.0)
MCH: 30.6 pg (ref 26.0–34.0)
MCHC: 34.8 g/dL (ref 30.0–36.0)
MCV: 88 fL (ref 78.0–100.0)
Platelets: 355 10*3/uL (ref 150–400)
RBC: 4.34 MIL/uL (ref 4.22–5.81)
RDW: 12.3 % (ref 11.5–15.5)
WBC: 10 10*3/uL (ref 4.0–10.5)

## 2010-11-14 LAB — PHOSPHORUS: Phosphorus: 3.4 mg/dL (ref 2.3–4.6)

## 2010-11-14 LAB — MAGNESIUM: Magnesium: 2.4 mg/dL (ref 1.5–2.5)

## 2010-11-14 LAB — URIC ACID: Uric Acid, Serum: 3.6 mg/dL — ABNORMAL LOW (ref 4.0–7.8)

## 2010-11-15 ENCOUNTER — Encounter: Payer: BC Managed Care – PPO | Admitting: Oncology

## 2010-11-15 DIAGNOSIS — C8589 Other specified types of non-Hodgkin lymphoma, extranodal and solid organ sites: Secondary | ICD-10-CM

## 2010-11-15 LAB — PHOSPHORUS: Phosphorus: 2.8 mg/dL (ref 2.3–4.6)

## 2010-11-15 LAB — BASIC METABOLIC PANEL
BUN: 11 mg/dL (ref 6–23)
CO2: 25 mEq/L (ref 19–32)
Calcium: 9 mg/dL (ref 8.4–10.5)
Chloride: 101 mEq/L (ref 96–112)
Creatinine, Ser: 0.71 mg/dL (ref 0.50–1.35)
GFR calc Af Amer: >60 mL/min (ref >60)
GFR calc non Af Amer: >60 mL/min (ref >60)
Glucose, Bld: 99 mg/dL (ref 70–99)
Potassium: 3.3 mEq/L — ABNORMAL LOW (ref 3.5–5.1)
Sodium: 135 mEq/L (ref 135–145)

## 2010-11-15 LAB — CBC
HCT: 34.9 % — ABNORMAL LOW (ref 39.0–52.0)
Hemoglobin: 12.3 g/dL — ABNORMAL LOW (ref 13.0–17.0)
MCH: 30.8 pg (ref 26.0–34.0)
MCHC: 35.2 g/dL (ref 30.0–36.0)
MCV: 87.5 fL (ref 78.0–100.0)
Platelets: 365 10*3/uL (ref 150–400)
RBC: 3.99 MIL/uL — ABNORMAL LOW (ref 4.22–5.81)
RDW: 12.4 % (ref 11.5–15.5)
WBC: 6.4 10*3/uL (ref 4.0–10.5)

## 2010-11-15 LAB — MAGNESIUM: Magnesium: 2.3 mg/dL (ref 1.5–2.5)

## 2010-11-15 LAB — URIC ACID: Uric Acid, Serum: 3.7 mg/dL — ABNORMAL LOW (ref 4.0–7.8)

## 2010-11-15 LAB — LACTATE DEHYDROGENASE: LDH: 583 U/L — ABNORMAL HIGH (ref 94–250)

## 2010-11-16 ENCOUNTER — Encounter: Payer: BC Managed Care – PPO | Admitting: Oncology

## 2010-11-16 ENCOUNTER — Inpatient Hospital Stay (HOSPITAL_COMMUNITY): Payer: BC Managed Care – PPO

## 2010-11-16 DIAGNOSIS — C8589 Other specified types of non-Hodgkin lymphoma, extranodal and solid organ sites: Secondary | ICD-10-CM

## 2010-11-16 DIAGNOSIS — Z5111 Encounter for antineoplastic chemotherapy: Secondary | ICD-10-CM

## 2010-11-16 LAB — MAGNESIUM: Magnesium: 2.2 mg/dL (ref 1.5–2.5)

## 2010-11-16 LAB — BASIC METABOLIC PANEL
BUN: 11 mg/dL (ref 6–23)
CO2: 25 mEq/L (ref 19–32)
Calcium: 9 mg/dL (ref 8.4–10.5)
Chloride: 99 mEq/L (ref 96–112)
Creatinine, Ser: 0.61 mg/dL (ref 0.50–1.35)
GFR calc Af Amer: >60 mL/min (ref >60)
GFR calc non Af Amer: >60 mL/min (ref >60)
Glucose, Bld: 88 mg/dL (ref 70–99)
Potassium: 3.3 mEq/L — ABNORMAL LOW (ref 3.5–5.1)
Sodium: 135 mEq/L (ref 135–145)

## 2010-11-16 LAB — PROTIME-INR
INR: 1.17 (ref 0.00–1.49)
Prothrombin Time: 15.1 seconds (ref 11.6–15.2)

## 2010-11-16 LAB — URIC ACID: Uric Acid, Serum: 3.3 mg/dL — ABNORMAL LOW (ref 4.0–7.8)

## 2010-11-16 LAB — PHOSPHORUS: Phosphorus: 3.1 mg/dL (ref 2.3–4.6)

## 2010-11-18 ENCOUNTER — Encounter (HOSPITAL_BASED_OUTPATIENT_CLINIC_OR_DEPARTMENT_OTHER): Payer: BC Managed Care – PPO | Admitting: Oncology

## 2010-11-18 DIAGNOSIS — C8589 Other specified types of non-Hodgkin lymphoma, extranodal and solid organ sites: Secondary | ICD-10-CM

## 2010-11-18 DIAGNOSIS — Z452 Encounter for adjustment and management of vascular access device: Secondary | ICD-10-CM

## 2010-11-19 ENCOUNTER — Encounter (HOSPITAL_BASED_OUTPATIENT_CLINIC_OR_DEPARTMENT_OTHER): Payer: BC Managed Care – PPO | Admitting: Oncology

## 2010-11-19 ENCOUNTER — Other Ambulatory Visit: Payer: Self-pay | Admitting: Oncology

## 2010-11-19 DIAGNOSIS — C8589 Other specified types of non-Hodgkin lymphoma, extranodal and solid organ sites: Secondary | ICD-10-CM

## 2010-11-19 LAB — CBC WITH DIFFERENTIAL/PLATELET
BASO%: 0.3 % (ref 0.0–2.0)
Basophils Absolute: 0 10*3/uL (ref 0.0–0.1)
EOS%: 1.9 % (ref 0.0–7.0)
Eosinophils Absolute: 0.1 10*3/uL (ref 0.0–0.5)
HCT: 42.2 % (ref 38.4–49.9)
HGB: 14.7 g/dL (ref 13.0–17.1)
LYMPH%: 8.8 % — ABNORMAL LOW (ref 14.0–49.0)
MCH: 31.3 pg (ref 27.2–33.4)
MCHC: 34.8 g/dL (ref 32.0–36.0)
MCV: 89.7 fL (ref 79.3–98.0)
MONO#: 0 10*3/uL — ABNORMAL LOW (ref 0.1–0.9)
MONO%: 0.7 % (ref 0.0–14.0)
NEUT#: 4.9 10*3/uL (ref 1.5–6.5)
NEUT%: 88.3 % — ABNORMAL HIGH (ref 39.0–75.0)
Platelets: 444 10*3/uL — ABNORMAL HIGH (ref 140–400)
RBC: 4.7 10*6/uL (ref 4.20–5.82)
RDW: 13.1 % (ref 11.0–14.6)
WBC: 5.5 10*3/uL (ref 4.0–10.3)
lymph#: 0.5 10*3/uL — ABNORMAL LOW (ref 0.9–3.3)

## 2010-11-23 ENCOUNTER — Encounter (HOSPITAL_BASED_OUTPATIENT_CLINIC_OR_DEPARTMENT_OTHER): Payer: BC Managed Care – PPO | Admitting: Oncology

## 2010-11-23 ENCOUNTER — Ambulatory Visit (HOSPITAL_COMMUNITY)
Admission: RE | Admit: 2010-11-23 | Discharge: 2010-11-23 | Disposition: A | Discharge status: Home / Self Care | Payer: BC Managed Care – PPO | Source: Ambulatory Visit | Attending: Oncology | Admitting: Oncology

## 2010-11-23 ENCOUNTER — Other Ambulatory Visit: Payer: Self-pay | Admitting: Oncology

## 2010-11-23 DIAGNOSIS — C8589 Other specified types of non-Hodgkin lymphoma, extranodal and solid organ sites: Secondary | ICD-10-CM

## 2010-11-23 DIAGNOSIS — R05 Cough: Secondary | ICD-10-CM | POA: Insufficient documentation

## 2010-11-23 DIAGNOSIS — R918 Other nonspecific abnormal finding of lung field: Secondary | ICD-10-CM

## 2010-11-23 DIAGNOSIS — R059 Cough, unspecified: Secondary | ICD-10-CM | POA: Insufficient documentation

## 2010-11-23 DIAGNOSIS — R222 Localized swelling, mass and lump, trunk: Secondary | ICD-10-CM | POA: Insufficient documentation

## 2010-11-23 DIAGNOSIS — Z452 Encounter for adjustment and management of vascular access device: Secondary | ICD-10-CM

## 2010-11-25 ENCOUNTER — Other Ambulatory Visit: Payer: Self-pay | Admitting: Oncology

## 2010-11-25 ENCOUNTER — Encounter (HOSPITAL_BASED_OUTPATIENT_CLINIC_OR_DEPARTMENT_OTHER): Payer: BC Managed Care – PPO | Admitting: Oncology

## 2010-11-25 DIAGNOSIS — R05 Cough: Secondary | ICD-10-CM

## 2010-11-25 DIAGNOSIS — R059 Cough, unspecified: Secondary | ICD-10-CM

## 2010-11-25 DIAGNOSIS — C8589 Other specified types of non-Hodgkin lymphoma, extranodal and solid organ sites: Secondary | ICD-10-CM

## 2010-11-25 DIAGNOSIS — I82629 Acute embolism and thrombosis of deep veins of unspecified upper extremity: Secondary | ICD-10-CM

## 2010-11-25 LAB — CBC WITH DIFFERENTIAL/PLATELET
BASO%: 1.3 % (ref 0.0–2.0)
Basophils Absolute: 0.1 10*3/uL (ref 0.0–0.1)
EOS%: 0.2 % (ref 0.0–7.0)
Eosinophils Absolute: 0 10*3/uL (ref 0.0–0.5)
HCT: 43.7 % (ref 38.4–49.9)
HGB: 15 g/dL (ref 13.0–17.1)
LYMPH%: 13.4 % — ABNORMAL LOW (ref 14.0–49.0)
MCH: 30.2 pg (ref 27.2–33.4)
MCHC: 34.3 g/dL (ref 32.0–36.0)
MCV: 88.1 fL (ref 79.3–98.0)
MONO#: 1.3 10*3/uL — ABNORMAL HIGH (ref 0.1–0.9)
MONO%: 12.6 % (ref 0.0–14.0)
NEUT#: 7.6 10*3/uL — ABNORMAL HIGH (ref 1.5–6.5)
NEUT%: 72.5 % (ref 39.0–75.0)
Platelets: 189 10*3/uL (ref 140–400)
RBC: 4.96 10*6/uL (ref 4.20–5.82)
RDW: 13 % (ref 11.0–14.6)
WBC: 10.5 10*3/uL — ABNORMAL HIGH (ref 4.0–10.3)
lymph#: 1.4 10*3/uL (ref 0.9–3.3)
nRBC: 0 % (ref 0–0)

## 2010-11-26 ENCOUNTER — Encounter (HOSPITAL_COMMUNITY)
Admission: RE | Admit: 2010-11-26 | Discharge: 2010-11-26 | Disposition: A | Discharge status: Home / Self Care | Payer: BC Managed Care – PPO | Source: Ambulatory Visit | Attending: Thoracic Surgery | Admitting: Thoracic Surgery

## 2010-11-26 LAB — COMPREHENSIVE METABOLIC PANEL
ALT: 62 U/L — ABNORMAL HIGH (ref 0–53)
AST: 24 U/L (ref 0–37)
Albumin: 3.7 g/dL (ref 3.5–5.2)
Alkaline Phosphatase: 79 U/L (ref 39–117)
BUN: 11 mg/dL (ref 6–23)
CO2: 28 mEq/L (ref 19–32)
Calcium: 9.4 mg/dL (ref 8.4–10.5)
Chloride: 98 mEq/L (ref 96–112)
Creatinine, Ser: 0.86 mg/dL (ref 0.50–1.35)
GFR calc Af Amer: >60 mL/min (ref >60)
GFR calc non Af Amer: >60 mL/min (ref >60)
Glucose, Bld: 109 mg/dL — ABNORMAL HIGH (ref 70–99)
Potassium: 3.8 mEq/L (ref 3.5–5.1)
Sodium: 137 mEq/L (ref 135–145)
Total Bilirubin: 0.2 mg/dL — ABNORMAL LOW (ref 0.3–1.2)
Total Protein: 6.3 g/dL (ref 6.0–8.3)

## 2010-11-26 LAB — CBC
HCT: 39.8 % (ref 39.0–52.0)
Hemoglobin: 14.1 g/dL (ref 13.0–17.0)
MCH: 31.1 pg (ref 26.0–34.0)
MCHC: 35.4 g/dL (ref 30.0–36.0)
MCV: 87.7 fL (ref 78.0–100.0)
Platelets: 211 10*3/uL (ref 150–400)
RBC: 4.54 MIL/uL (ref 4.22–5.81)
RDW: 12.8 % (ref 11.5–15.5)
WBC: 8 10*3/uL (ref 4.0–10.5)

## 2010-11-26 LAB — PROTIME-INR
INR: 1.01 (ref 0.00–1.49)
Prothrombin Time: 13.5 seconds (ref 11.6–15.2)

## 2010-11-26 LAB — APTT: aPTT: 35 seconds (ref 24–37)

## 2010-11-30 ENCOUNTER — Other Ambulatory Visit: Payer: Self-pay | Admitting: Thoracic Surgery

## 2010-11-30 ENCOUNTER — Ambulatory Visit (HOSPITAL_COMMUNITY): Payer: BC Managed Care – PPO

## 2010-11-30 ENCOUNTER — Ambulatory Visit (HOSPITAL_COMMUNITY)
Admission: RE | Admit: 2010-11-30 | Discharge: 2010-11-30 | Disposition: A | Discharge status: Home / Self Care | Payer: BC Managed Care – PPO | Source: Ambulatory Visit | Attending: Thoracic Surgery | Admitting: Thoracic Surgery

## 2010-11-30 DIAGNOSIS — C8582 Other specified types of non-Hodgkin lymphoma, intrathoracic lymph nodes: Secondary | ICD-10-CM

## 2010-11-30 DIAGNOSIS — C8589 Other specified types of non-Hodgkin lymphoma, extranodal and solid organ sites: Secondary | ICD-10-CM | POA: Insufficient documentation

## 2010-11-30 DIAGNOSIS — Z01810 Encounter for preprocedural cardiovascular examination: Secondary | ICD-10-CM | POA: Insufficient documentation

## 2010-11-30 DIAGNOSIS — Z01812 Encounter for preprocedural laboratory examination: Secondary | ICD-10-CM | POA: Insufficient documentation

## 2010-11-30 DIAGNOSIS — C8590 Non-Hodgkin lymphoma, unspecified, unspecified site: Secondary | ICD-10-CM

## 2010-11-30 DIAGNOSIS — Z01818 Encounter for other preprocedural examination: Secondary | ICD-10-CM | POA: Insufficient documentation

## 2010-11-30 HISTORY — PX: OTHER SURGICAL HISTORY: SHX169

## 2010-12-04 ENCOUNTER — Encounter (HOSPITAL_BASED_OUTPATIENT_CLINIC_OR_DEPARTMENT_OTHER): Payer: BC Managed Care – PPO | Admitting: Oncology

## 2010-12-04 ENCOUNTER — Other Ambulatory Visit: Payer: Self-pay | Admitting: Oncology

## 2010-12-04 DIAGNOSIS — C8589 Other specified types of non-Hodgkin lymphoma, extranodal and solid organ sites: Secondary | ICD-10-CM

## 2010-12-04 DIAGNOSIS — Z5111 Encounter for antineoplastic chemotherapy: Secondary | ICD-10-CM

## 2010-12-04 LAB — COMPREHENSIVE METABOLIC PANEL
ALT: 77 U/L — ABNORMAL HIGH (ref 0–53)
AST: 27 U/L (ref 0–37)
Albumin: 4 g/dL (ref 3.5–5.2)
Alkaline Phosphatase: 66 U/L (ref 39–117)
BUN: 14 mg/dL (ref 6–23)
CO2: 26 mEq/L (ref 19–32)
Calcium: 10 mg/dL (ref 8.4–10.5)
Chloride: 101 mEq/L (ref 96–112)
Creatinine, Ser: 0.72 mg/dL (ref 0.50–1.35)
Glucose, Bld: 89 mg/dL (ref 70–99)
Potassium: 4.3 mEq/L (ref 3.5–5.3)
Sodium: 137 mEq/L (ref 135–145)
Total Bilirubin: 0.2 mg/dL — ABNORMAL LOW (ref 0.3–1.2)
Total Protein: 6.8 g/dL (ref 6.0–8.3)

## 2010-12-04 LAB — CBC WITH DIFFERENTIAL/PLATELET
BASO%: 1.9 % (ref 0.0–2.0)
Basophils Absolute: 0.1 10*3/uL (ref 0.0–0.1)
EOS%: 0 % (ref 0.0–7.0)
Eosinophils Absolute: 0 10*3/uL (ref 0.0–0.5)
HCT: 41.2 % (ref 38.4–49.9)
HGB: 14.3 g/dL (ref 13.0–17.1)
LYMPH%: 27 % (ref 14.0–49.0)
MCH: 30.8 pg (ref 27.2–33.4)
MCHC: 34.7 g/dL (ref 32.0–36.0)
MCV: 88.6 fL (ref 79.3–98.0)
MONO#: 0.3 10*3/uL (ref 0.1–0.9)
MONO%: 9.3 % (ref 0.0–14.0)
NEUT#: 2.3 10*3/uL (ref 1.5–6.5)
NEUT%: 61.8 % (ref 39.0–75.0)
Platelets: 343 10*3/uL (ref 140–400)
RBC: 4.65 10*6/uL (ref 4.20–5.82)
RDW: 14.3 % (ref 11.0–14.6)
WBC: 3.7 10*3/uL — ABNORMAL LOW (ref 4.0–10.3)
lymph#: 1 10*3/uL (ref 0.9–3.3)
nRBC: 0 % (ref 0–0)

## 2010-12-04 LAB — LACTATE DEHYDROGENASE: LDH: 189 U/L (ref 94–250)

## 2010-12-04 LAB — PROTIME-INR
INR: 1.3 — ABNORMAL LOW (ref 2.00–3.50)
Protime: 15.6 Seconds — ABNORMAL HIGH (ref 10.6–13.4)

## 2010-12-05 ENCOUNTER — Encounter: Payer: BC Managed Care – PPO | Admitting: Oncology

## 2010-12-07 NOTE — Op Note (Signed)
  NAMEKHYLER, ESCHMANN NO.:  [id number]  MEDICAL RECORD NO.:  [id number]  LOCATION:  XRAY                         FACILITY:  [facility]  PHYSICIAN:  Ines Bloomer, M.D. DATE OF BIRTH:  [birth year]  DATE OF PROCEDURE:  11/30/2010 DATE OF DISCHARGE:                              OPERATIVE REPORT   PREOPERATIVE DIAGNOSIS:  Lymphoma.  POSTOPERATIVE DIAGNOSIS:  Lymphoma.  OPERATION PERFORMED:  Insertion of right IJ Port-A-Cath.  SURGEON:  Ines Bloomer, MD  ANESTHESIA:  Xylocaine 1% anesthesia and IV sedation.  After prepping and draping the right chest area, right IJ puncture was performed after infiltrating 1% Xylocaine.  Guidewire was threaded to the right atrium and through the right atrium under fluoro guidance and an area was infiltrated with 1% Xylocaine inferior to this and the pocket was dissected out.  The Port-A-Cath was placed in the pocket and then the tubing tunneled from the pocket to the stab wound.  Over the stab wound, was passed a dilator with peel-away sheath.  The Port-A-Cath had been cut to go to the right atrium.  Dilator and guidewire were removed and through the peel-away sheath, was passed the tubing and the tubing was removed.  It was confirmed to be in the proximal part of the right atrium under fluoro.  Wounds were closed with 3-0 Vicryl and Dermabond for the skin.  The patient turned to recovery room in stable condition.     Ines Bloomer, M.D.     DPB/MEDQ  D:  11/30/2010  T:  11/30/2010  Job:  [id number]  Electronically Signed by Jovita Gamma M.D. on 12/07/2010 12:50:44 PM

## 2010-12-07 NOTE — Discharge Summary (Signed)
[name], Jim NO.:  [id number]  MEDICAL RECORD NO.:  [id number]  LOCATION:  1317                         FACILITY[facility]  [facility]  PHYSICIAN: Leighton Roach. Doratha Mcswain. MD     DATE OF BIRTH:  1980-08-20  DATE OF ADMISSION:  11/12/2010 DATE OF DISCHARGE:  11/16/2010                              DISCHARGE SUMMARY   DISCHARGE DIAGNOSES: 1. Newly diagnosed non-Hodgkin lymphoma, diffuse large B-cell, CD20     positive involving a large anterior mediastinal mass, status post     chemotherapy, first cycle of R-CHOP on November 13, 2010.  The     patient is stage IV post chemotherapy. 2. Cough, nonproductive, secondary to newly diagnosed non-Hodgkin     lymphoma, controlled with nebulizers, incentive spirometer, and     Tussionex. 3. Left upper extremity deep vein thrombosis, on Lovenox during     hospitalization at 90 mg subcutaneous q.12 h, status post day #1 of     Coumadin per Pharmacy.  This medication is to be placed on hold     until the patient returns to the office. 4. Postobstructive pneumonitis, status post Avelox during     hospitalization. 5. Neupogen prophylaxis, day #2, for neutrophil support after     chemotherapy. 6. Tumor lysis prophylaxis, on allopurinol and IV fluids. 7. Fever, secondary to newly diagnosed non-Hodgkin lymphoma, resolved. 8. Tachycardia, secondary to newly diagnosed non-Hodgkin lymphoma,     status post EKG, unremarkable.  The patient is status post 2-D     echo, with results pending.  OTHER PERTINENT MEDICAL HISTORY: 1. Status post remote eardrum repair. 2. Status post extraction of multiple wisdom teeth.  ALLERGIES:  Questionable allergy to [facility].  MEDICATIONS ON DISCHARGE: 1. Prednisone 20 mg, 5 p.o. for 1 day total 100 mg. 2. Lovenox 90 mg subcutaneous b.i.d. for 10 days. 3. Compazine 10 mg p.o. q.6 h. p.r.n. 4. Allopurinol 300 mg p.o. daily for 7 days. 5. Neupogen 300 mcg subcutaneous daily for 5 days, starting on  November 17, 2010. 6. Tylenol 325 mg p.o. q.4 h. p.r.n. 1-2 tablets. 7. Tussionex ER oral suspension 1 teaspoon nightly p.r.n. 8. Percocet 5/325 mg p.o., 1-2 tablets q.4 h. p.r.n. 9. Albuterol nebulizer 1 nebulization q.i.d.  CONDITION ON DISCHARGE:  Stable.  FOLLOWUP:  As instructed to by Dr. Truett Perna.  LABORATORY DATA ON DISCHARGE:  Uric acid 3.3, phosphate 3.1, magnesium 2.2, sodium 135, potassium 3.3, BUN 11, creatinine 0.61, glucose 88, calcium 9.0.  PT 15.1, INR 1.17.  LDH on September 9th is 583.  RADIOLOGICAL STUDIES:  Status post chest x-ray on November 16, 2010, showing no significant change enlarged left mediastinal and hilar mass, stable elevated left hemidiaphragm with small left pleural effusion and atelectasis.  PROCEDURES: 1. Status post CT-guided biopsy on November 13, 2010, by     Interventional Radiology, of the right iliac bone marrow aspiration     and core biopsy. 2. Status post chemotherapy on November 13, 2010, with CHOP - Rituxan.     This consists of cyclophosphamide 750 mg/m2 equals 1600 mg IV on     day #1 only; doxorubicin 50 mg per/m2 equals 105 mg IV on day #1  only.  Vincristine 1.4 mg/m2 equals 2 mg IV on day #1 only.     Prednisone 80 mg p.o. daily for 5 days, starting on November 13, 2010.  Rituxan 375 mg/m2 equals 800 mg IV on day #2. 3. Pre-medications consist of Tylenol 650 mg p.o., along with Benadryl     50 mg p.o. pre Rituxan.  Zofran 60 mg IV pre chemotherapy.  This     chemotherapy is administered for a height of 71 inches, weight of     92.3, and a BSA of 2.14 m2, for a weight of 203 pounds.  HISTORY OF PRESENT ILLNESS:  [name] is a very pleasant 30 year old man, initially diagnosed with bronchitis back in August 2012, nonresolving after the use of several medications.  Thus, a chest x-ray on October 12, 2010, revealed a mediastinal mass, for which a followup CT of the chest on October 12, 2010, was performed.  This revealed a  large irregular mediastinal mass at the middle mediastinum on the left.  There was associated small pericardial effusion.  The mass originally measured 8 x 8 x 8 cm.  The upper abdomen was unremarkable with the exception of fatty infiltration of the liver.  He was referred to Dr. Edwyna Shell, undergoing mediastinotomy on November 06, 2010.  Multiple biopsies were obtained.  Final pathology showed non-Hodgkin lymphoma, diffuse large B- cell type, CD20 positive.  Staging PET scan on November 11, 2010, showed the large anterior mediastinal mass, intensively hypermetabolic, with an SUV of 36.9 maximum.  The mass measured 12.1 x 8.6 x 12.2 cm.  This exerted mass effect upon the left mainstem bronchus, causing postobstructive pneumonitis within the left lower lobe and left upper lobe.  To the right of the midline, there were 2 focal areas of increased radiotracer uptake, within the anterior mediastinum as well as small left pleural effusion.  No evidence of hypermetabolic tumor within the abdomen and pelvis.  Mild diffuse bone marrow uptake was noted.  No aggressive bone lesions were noted.  He was then seen by Dr. Truett Perna, after presenting with cough, fever, and sweats.  Thus, he was admitted for further evaluation, treatment of symptoms, in anticipation that staging evaluation was to be initiated, with plans to be in treatment with CHOP and Rituxan as soon as possible.  HOSPITAL COURSE:  Upon admission, Interventional Radiology was contacted, in order to proceed with bone marrow biopsy.  He tolerated the procedure well and while waiting for the results, left upper extremity venous Doppler was performed, for evaluation of pain in the left upper extremity and the presence of subcutaneous edema.  The Doppler was positive for DVT at the brachial axillary and subclavian as well as internal jugular veins.  It was positive for superficial thrombosis of the basilic vein of the upper arm.  There was  no propagation to the right side.  Thus, after these findings, Lovenox was initiated per Pharmacy.  Chemotherapy was started on November 13, 2010, as mentioned above.  He tolerated the procedure well, with the exception of some nausea, well controlled with antiemetics. Of note, the rest of his hospitalization, was guided to the treatment of his symptoms.  His cough was better controlled with antitussives, as well as the treatment with Avelox for postobstructive pneumonitis.  His left upper extremity DVT, treated with Lovenox, was bridged to Coumadin x 1 on November 15, 2010.  His arm began to show less tenderness and swelling and supportive treatment continues.  The Coumadin  will be placed on call and Lovenox will continue as a therapy and once re-assessed at the office of the cancer center, further recommendations are to proceed.  All his labs were closely monitored.  On November 14, 2010, his white count was 10, but as of November 15, 2010, it was 6.4.  Anticipating that after the chemotherapy his white count would drop, Neupogen 300 mcg subcutaneous daily for neutrophil support was given on November 15, 2010, and he is to continue upon discharge as directed.  In addition, his uric acid was watched, for prevention of tumor lysis syndrome.  Allopurinol was given and is also continued to be administered as an outpatient as directed.  Today, his vital signs showed normal blood pressure, but his tachycardiac continued for which an EKG was performed, to rule out any abnormalities, and is essentially unremarkable for any acute issues.  A 2-D echo has been performed as well, but the results are currently pending.  He states that he is less tender in his left upper extremity, the nausea has improved, the cough has improved, and there is no bleeding noted.  Nausea has been present intermittently, but has been well controlled with the medications.  His chest x-ray stable, showing a left  hemidiaphragm, and small left pleural effusion, with atelectatic changes, but no new findings.  Thus, the patient appears to be feeling better, having tolerated R-CHOP well. There is no evidence of tumor lysis syndrome.  Thus, he is to be discharged home today, with all prescriptions wrritten for prednisone, Lovenox, Compazine, allopurinol, and Neupogen.  Coumadin, as mentioned above, will be placed on hold temporarily.  The patient has an appointment on November 19, 2010, at 8:30 in the morning with Dr. Truett Perna, at which time, further recommendations are to take place.  He knows to call for shortness of breath, bleeding, or if his fever rises to greater than 101.     Marlowe Kays, PA-C     Leighton Roach. Truett Perna, MD   SW/MEDQ  D:  11/16/2010  T:  11/16/2010  Job:  [id number]  cc:   Ines Bloomer, M.D. [address] [place], [place] [address]  Electronically Signed by Marlowe Kays P.A. on 11/18/2010 03:15:44 PM Electronically Signed by Thornton Papas M.D. on 12/07/2010 08:41:01 AM

## 2010-12-07 NOTE — H&P (Signed)
NAMETYRAE, ALCOSER NO.:  [id number]  MEDICAL RECORD NO.:  [id number]  LOCATION:  1317                         FACILITY:  [facility]  PHYSICIAN:  Ladene Artist, M.D.  DATE OF BIRTH:  [birth year]  DATE OF ADMISSION:  11/12/2010 DATE OF DISCHARGE:                             HISTORY & PHYSICAL   REASON FOR HOSPITALIZATION:  Newly diagnosed non-Hodgkin lymphoma, for initiation of chemotherapy.  HISTORY OF PRESENT ILLNESS:  Mr. Trella is a 30 year old man who initially presented approximately 4 weeks ago with cough and shortness of breath.  He reports being diagnosed with "bronchitis."  Chest x-ray on October 12, 2010, showed mediastinal mass versus thoracic aneurysm. Followup chest CT also on October 12, 2010, showed a large irregular and heterogeneously enhancing mediastinal mass involving the anterior and middle mediastinum on the left side.  There was an associated small pericardial effusion.  The mediastinal mass measured 8 x 8 x 8 cm.  The upper abdomen was unremarkable except for fatty infiltration of the liver.  Mr. Mcmahill was referred to Dr. Edwyna Shell and underwent mediastinotomy on November 06, 2010.  Multiple biopsies of the mass were obtained.  Final pathology showed non-Hodgkin lymphoma, diffuse large B-cell type, CD20 positive.  Staging PET scan on November 11, 2010, showed the large anterior mediastinal mass to be intensely hypermetabolic with an SUV max of 36.9.  The mass measured 12.1 x 8.6 x 12.2 cm.  The mass exerted mass effect upon the left mainstem bronchus.  Areas of postobstructive pneumonitis were noted within the left lower lobe and left upper lobe. To the right of the midline, there were 2 focal areas of increased radiotracer uptake within the anterior mediastinum.  There was a small left pleural effusion.  There was no evidence for hypermetabolic tumor within the abdomen or pelvis.  Mild diffuse bone marrow uptake was noted.  No aggressive bone  lesions were noted on the corresponding CT images.  Mr. Ketcher was referred to Dr. Truett Perna for initiation of treatment for newly-diagnosed non-Hodgkin lymphoma.  PAST MEDICAL HISTORY:  None.  PAST SURGICAL HISTORY: 1. Remote eardrum repair. 2. Extraction of multiple wisdom teeth.  MEDICATIONS:  Avelox, Mucinex, and albuterol nebulizer.  ALLERGIES:  Questionable allergy to [facility].  FAMILY HISTORY:  Mother has alpha-1-antitrypsin deficiency.  Father has hypertension, diabetes, acid reflux, and ulcerative colitis.  Paternal grandfather died at age 49.  He had coronary artery disease, hypertension, and diabetes.  Paternal grandmother had a stroke at age 21.  Maternal grandmother is 25 years old.  She has a history of sinus cancer.  Maternal grandfather had heart disease.  Mr. Bellamy has 2 siblings, a brother age 16 and a sister age 67.  Both reported to be in good health.  SOCIAL HISTORY:  Mr. Lowenthal lives in [place].  He is single.  He has no children.  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  No tobacco use.  He reports occasional EtOH intake.  He has never had a blood transfusion. He denies risk doctors for HIV, hepatitis.  REVIEW OF SYSTEMS:  Mr. Masoud reports night sweats occurring intermittently since April/May.  For the past 5 to 6 days,  he has had an intermittent low-grade fever ranging from 99 to 101 degrees.  No anorexia or weight loss.  Over the past several days, he has had pain in the right axilla as well as a rash.  No unusual headaches.  He denies any visual disturbance.  No shortness of breath.  He has had cough intermittently for the past 4 weeks.  Cough is occasionally productive. His father reports that he is sleeping in a recliner.  The cough worsens when he lays flat.  No nausea or vomiting.  Bowels moving regularly.  No hematochezia or melena.  No hematuria or dysuria.  No focal extremity weakness.  He denies numbness or tingling in his hands or  feet.  PHYSICAL EXAM:  VITAL SIGNS:  Temperature 100.5, heart rate ranging from 130 to 140, respirations 20, blood pressure 127/86, weight 203 pounds, height 71 inches.  Oxygen saturation 95% on room air. GENERAL:  Pleasant male in no acute distress. HEENT:  Normocephalic, atraumatic.  Pupils equal, round, reactive to light.  Extraocular movements are intact.  Sclerae anicteric. Oropharynx is without thrush or ulceration. LYMPH:  No palpable cervical, supraclavicular, axillary, or inguinal lymph nodes. CHEST:  Breath sounds are diminished at the left base. CARDIOVASCULAR:  Regular, tachycardic. ABDOMEN:  Soft, nontender.  No organomegaly. EXTREMITIES:  Lower extremities are without edema.  The left arm and left upper chest are edematous.  There is subcutaneous emphysema at the left upper chest.  He is markedly tender at the left axilla. NEURO:  Alert and oriented.  Motor strength 5/5.  Knee DTRs 2+, symmetric.  Finger-to-nose intact.  Vibratory sense is intact over the fingertips per tuning fork exam. GU:  The right testicle appears larger than the left.  No discrete testicular mass.  LABS:  Hemoglobin 14.6, white count 9.6, absolute neutrophil count 8.1, platelet count 368,000.  Chemistry panel pending.  IMPRESSION: 1. Recently diagnosed non-Hodgkin lymphoma, diffuse large B-cell, CD20     positive, involving a large anterior mediastinal mass. 2. Cough secondary to #1. 3. Tachycardia. 4. Fever/sweats secondary to #1.  PLAN:  Mr. Mathes has been diagnosed with non-Hodgkin lymphoma, diffuse large B-cell, CD20 positive involving a large anterior mediastinal mass. He is symptomatic with cough, fever, and sweats.  He is being admitted to [facility] tonight to expedite the staging evaluation with plans to begin treatment with CHOP and Rituxan as soon as possible.  Dr. Truett Perna has discussed his case with Dr. Edwyna Shell. Interventional Radiology is being consulted for a bone  marrow biopsy and placement of a PICC line on November 13, 2010.  Patient interviewed and examined by Dr. Truett Perna; plan per Dr. Truett Perna.     Arnaldo Natal, NP   ______________________________ Ladene Artist, M.D.    LCT/MEDQ  D:  11/12/2010  T:  11/13/2010  Job:  [id number]  cc:   Ines Bloomer, M.D. [address] [place], [place] [address]  Electronically Signed by Lonna Cobb N.P. on 11/20/2010 09:06:52 AM Electronically Signed by Thornton Papas M.D. on 12/07/2010 08:41:07 AM

## 2010-12-08 ENCOUNTER — Ambulatory Visit: Payer: Self-pay | Admitting: Thoracic Surgery

## 2010-12-08 DIAGNOSIS — C8590 Non-Hodgkin lymphoma, unspecified, unspecified site: Secondary | ICD-10-CM | POA: Insufficient documentation

## 2010-12-10 ENCOUNTER — Other Ambulatory Visit: Payer: Self-pay | Admitting: Oncology

## 2010-12-10 ENCOUNTER — Encounter (HOSPITAL_BASED_OUTPATIENT_CLINIC_OR_DEPARTMENT_OTHER): Payer: BC Managed Care – PPO | Admitting: Oncology

## 2010-12-10 DIAGNOSIS — C8589 Other specified types of non-Hodgkin lymphoma, extranodal and solid organ sites: Secondary | ICD-10-CM

## 2010-12-10 LAB — PROTIME-INR
INR: 2.7 (ref 2.00–3.50)
Protime: 32.4 Seconds — ABNORMAL HIGH (ref 10.6–13.4)

## 2010-12-11 ENCOUNTER — Encounter: Payer: Self-pay | Admitting: *Deleted

## 2010-12-16 ENCOUNTER — Ambulatory Visit (INDEPENDENT_AMBULATORY_CARE_PROVIDER_SITE_OTHER): Payer: Self-pay | Admitting: Thoracic Surgery

## 2010-12-16 ENCOUNTER — Encounter: Payer: Self-pay | Admitting: Thoracic Surgery

## 2010-12-16 VITALS — BP 142/81 | HR 100 | Resp 20 | Ht 72.0 in | Wt 200.0 lb

## 2010-12-16 DIAGNOSIS — Z09 Encounter for follow-up examination after completed treatment for conditions other than malignant neoplasm: Secondary | ICD-10-CM

## 2010-12-16 DIAGNOSIS — C8582 Other specified types of non-Hodgkin lymphoma, intrathoracic lymph nodes: Secondary | ICD-10-CM

## 2010-12-16 DIAGNOSIS — C8599 Non-Hodgkin lymphoma, unspecified, extranodal and solid organ sites: Secondary | ICD-10-CM

## 2010-12-16 NOTE — Progress Notes (Signed)
HPI the patient returns for followup. I his incisions are well-healed. His left arm has decreased in size. His Port-A-Cath is working well. We will see him back again as needed.  Current Outpatient Prescriptions  Medication Sig Dispense Refill  . acetaminophen (TYLENOL) 650 MG CR tablet Take 650 mg by mouth every 4 (four) hours as needed.        Marland Kitchen oxyCODONE-acetaminophen (PERCOCET) 5-325 MG per tablet Take 2 tablets by mouth every 4 (four) hours as needed.        . warfarin (COUMADIN) 5 MG tablet Take 5 mg by mouth daily.        . ALBUTEROL SULFATE IN Inhale into the lungs 4 (four) times daily as needed. HHN       . chlorpheniramine-HYDROcodone (TUSSIONEX) 10-8 MG/5ML LQCR Take 5 mLs by mouth daily as needed.        . fexofenadine (ALLEGRA) 180 MG tablet Take 180 mg by mouth daily.        . filgrastim (NEUPOGEN) 300 MCG/ML injection Inject 300 mcg into the skin daily. X 5 days after each chemo       . guaiFENesin (MUCINEX) 600 MG 12 hr tablet Take 1,200 mg by mouth 2 (two) times daily.        Marland Kitchen HYDROcodone-homatropine (HYCODAN) 5-1.5 MG/5ML syrup Take by mouth every 6 (six) hours as needed.        . montelukast (SINGULAIR) 10 MG tablet Take 10 mg by mouth at bedtime.        . moxifloxacin (AVELOX) 400 MG tablet Take 400 mg by mouth daily. For 5 days       . predniSONE (DELTASONE) 20 MG tablet Take 100 mg by mouth daily. X 5 days after each chemo       . prochlorperazine (COMPAZINE) 10 MG tablet Take 10 mg by mouth every 6 (six) hours as needed.           Review of Systems:no change   Physical Exam  Cardiovascular: Normal rate, regular rhythm and normal heart sounds.   Pulmonary/Chest: Breath sounds normal. No respiratory distress.   incisions are of Port-A-Cath and mediastinotomy are well-healed.   Diagnostic Tests:none   Impression: Status post chemotherapy for B-cell lymphoma   Plan: Return as needed

## 2010-12-17 ENCOUNTER — Other Ambulatory Visit: Payer: Self-pay | Admitting: Oncology

## 2010-12-17 ENCOUNTER — Encounter (HOSPITAL_BASED_OUTPATIENT_CLINIC_OR_DEPARTMENT_OTHER): Payer: BC Managed Care – PPO | Admitting: Oncology

## 2010-12-17 DIAGNOSIS — C8589 Other specified types of non-Hodgkin lymphoma, extranodal and solid organ sites: Secondary | ICD-10-CM

## 2010-12-17 LAB — CBC WITH DIFFERENTIAL/PLATELET
BASO%: 0.6 % (ref 0.0–2.0)
Basophils Absolute: 0.1 10*3/uL (ref 0.0–0.1)
EOS%: 0.3 % (ref 0.0–7.0)
Eosinophils Absolute: 0 10*3/uL (ref 0.0–0.5)
HCT: 42.8 % (ref 38.4–49.9)
HGB: 15 g/dL (ref 13.0–17.1)
LYMPH%: 14 % (ref 14.0–49.0)
MCH: 31.5 pg (ref 27.2–33.4)
MCHC: 35 g/dL (ref 32.0–36.0)
MCV: 90 fL (ref 79.3–98.0)
MONO#: 0.6 10*3/uL (ref 0.1–0.9)
MONO%: 5.4 % (ref 0.0–14.0)
NEUT#: 9.4 10*3/uL — ABNORMAL HIGH (ref 1.5–6.5)
NEUT%: 79.7 % — ABNORMAL HIGH (ref 39.0–75.0)
Platelets: 197 10*3/uL (ref 140–400)
RBC: 4.76 10*6/uL (ref 4.20–5.82)
RDW: 15.4 % — ABNORMAL HIGH (ref 11.0–14.6)
WBC: 11.8 10*3/uL — ABNORMAL HIGH (ref 4.0–10.3)
lymph#: 1.7 10*3/uL (ref 0.9–3.3)

## 2010-12-17 LAB — PROTIME-INR
INR: 2.2 (ref 2.00–3.50)
Protime: 26.4 Seconds — ABNORMAL HIGH (ref 10.6–13.4)

## 2010-12-25 ENCOUNTER — Other Ambulatory Visit: Payer: Self-pay | Admitting: Oncology

## 2010-12-25 ENCOUNTER — Encounter (HOSPITAL_BASED_OUTPATIENT_CLINIC_OR_DEPARTMENT_OTHER): Payer: BC Managed Care – PPO | Admitting: Oncology

## 2010-12-25 ENCOUNTER — Ambulatory Visit (HOSPITAL_COMMUNITY)
Admission: RE | Admit: 2010-12-25 | Discharge: 2010-12-25 | Disposition: A | Discharge status: Home / Self Care | Payer: BC Managed Care – PPO | Source: Ambulatory Visit | Attending: Oncology | Admitting: Oncology

## 2010-12-25 DIAGNOSIS — R05 Cough: Secondary | ICD-10-CM

## 2010-12-25 DIAGNOSIS — C8589 Other specified types of non-Hodgkin lymphoma, extranodal and solid organ sites: Secondary | ICD-10-CM

## 2010-12-25 DIAGNOSIS — C801 Malignant (primary) neoplasm, unspecified: Secondary | ICD-10-CM

## 2010-12-25 DIAGNOSIS — Z5111 Encounter for antineoplastic chemotherapy: Secondary | ICD-10-CM

## 2010-12-25 DIAGNOSIS — R059 Cough, unspecified: Secondary | ICD-10-CM

## 2010-12-25 DIAGNOSIS — I82629 Acute embolism and thrombosis of deep veins of unspecified upper extremity: Secondary | ICD-10-CM

## 2010-12-25 LAB — CBC WITH DIFFERENTIAL/PLATELET
BASO%: 0.9 % (ref 0.0–2.0)
Basophils Absolute: 0.1 10*3/uL (ref 0.0–0.1)
EOS%: 0.2 % (ref 0.0–7.0)
Eosinophils Absolute: 0 10*3/uL (ref 0.0–0.5)
HCT: 42.8 % (ref 38.4–49.9)
HGB: 15.1 g/dL (ref 13.0–17.1)
LYMPH%: 14.9 % (ref 14.0–49.0)
MCH: 31 pg (ref 27.2–33.4)
MCHC: 35.3 g/dL (ref 32.0–36.0)
MCV: 87.9 fL (ref 79.3–98.0)
MONO#: 0.6 10*3/uL (ref 0.1–0.9)
MONO%: 8.9 % (ref 0.0–14.0)
NEUT#: 5 10*3/uL (ref 1.5–6.5)
NEUT%: 75.1 % — ABNORMAL HIGH (ref 39.0–75.0)
Platelets: 232 10*3/uL (ref 140–400)
RBC: 4.87 10*6/uL (ref 4.20–5.82)
RDW: 16.1 % — ABNORMAL HIGH (ref 11.0–14.6)
WBC: 6.7 10*3/uL (ref 4.0–10.3)
lymph#: 1 10*3/uL (ref 0.9–3.3)
nRBC: 0 % (ref 0–0)

## 2010-12-25 LAB — PROTIME-INR
INR: 2.6 (ref 2.00–3.50)
Protime: 31.2 Seconds — ABNORMAL HIGH (ref 10.6–13.4)

## 2010-12-25 LAB — COMPREHENSIVE METABOLIC PANEL
ALT: 48 U/L (ref 0–53)
AST: 20 U/L (ref 0–37)
Albumin: 4.2 g/dL (ref 3.5–5.2)
Alkaline Phosphatase: 77 U/L (ref 39–117)
BUN: 12 mg/dL (ref 6–23)
CO2: 28 mEq/L (ref 19–32)
Calcium: 9.8 mg/dL (ref 8.4–10.5)
Chloride: 102 mEq/L (ref 96–112)
Creatinine, Ser: 0.74 mg/dL (ref 0.50–1.35)
Glucose, Bld: 114 mg/dL — ABNORMAL HIGH (ref 70–99)
Potassium: 3.8 mEq/L (ref 3.5–5.3)
Sodium: 138 mEq/L (ref 135–145)
Total Bilirubin: 0.3 mg/dL (ref 0.3–1.2)
Total Protein: 6.8 g/dL (ref 6.0–8.3)

## 2010-12-26 ENCOUNTER — Encounter: Payer: BC Managed Care – PPO | Admitting: Oncology

## 2010-12-26 DIAGNOSIS — Z5189 Encounter for other specified aftercare: Secondary | ICD-10-CM

## 2011-01-03 ENCOUNTER — Other Ambulatory Visit: Payer: Self-pay | Admitting: Oncology

## 2011-01-03 DIAGNOSIS — C8590 Non-Hodgkin lymphoma, unspecified, unspecified site: Secondary | ICD-10-CM

## 2011-01-03 DIAGNOSIS — I82629 Acute embolism and thrombosis of deep veins of unspecified upper extremity: Secondary | ICD-10-CM

## 2011-01-06 ENCOUNTER — Other Ambulatory Visit: Payer: Self-pay | Admitting: Oncology

## 2011-01-06 DIAGNOSIS — C8590 Non-Hodgkin lymphoma, unspecified, unspecified site: Secondary | ICD-10-CM

## 2011-01-07 ENCOUNTER — Other Ambulatory Visit: Payer: BC Managed Care – PPO | Admitting: Lab

## 2011-01-07 ENCOUNTER — Encounter (HOSPITAL_BASED_OUTPATIENT_CLINIC_OR_DEPARTMENT_OTHER): Payer: BC Managed Care – PPO | Admitting: Oncology

## 2011-01-07 ENCOUNTER — Other Ambulatory Visit: Payer: Self-pay | Admitting: Oncology

## 2011-01-07 DIAGNOSIS — Z5111 Encounter for antineoplastic chemotherapy: Secondary | ICD-10-CM

## 2011-01-07 DIAGNOSIS — I82629 Acute embolism and thrombosis of deep veins of unspecified upper extremity: Secondary | ICD-10-CM

## 2011-01-07 DIAGNOSIS — Z7901 Long term (current) use of anticoagulants: Secondary | ICD-10-CM

## 2011-01-07 DIAGNOSIS — Z5181 Encounter for therapeutic drug level monitoring: Secondary | ICD-10-CM

## 2011-01-07 LAB — PROTIME-INR
INR: 1.5 — ABNORMAL LOW (ref 2.00–3.50)
Protime: 18 Seconds — ABNORMAL HIGH (ref 10.6–13.4)

## 2011-01-10 ENCOUNTER — Other Ambulatory Visit: Payer: Self-pay | Admitting: Oncology

## 2011-01-14 ENCOUNTER — Other Ambulatory Visit: Payer: Self-pay | Admitting: Oncology

## 2011-01-14 ENCOUNTER — Ambulatory Visit (HOSPITAL_BASED_OUTPATIENT_CLINIC_OR_DEPARTMENT_OTHER): Payer: BC Managed Care – PPO | Admitting: Oncology

## 2011-01-14 ENCOUNTER — Telehealth: Payer: Self-pay | Admitting: Oncology

## 2011-01-14 ENCOUNTER — Ambulatory Visit (HOSPITAL_BASED_OUTPATIENT_CLINIC_OR_DEPARTMENT_OTHER): Payer: BC Managed Care – PPO

## 2011-01-14 ENCOUNTER — Other Ambulatory Visit (HOSPITAL_BASED_OUTPATIENT_CLINIC_OR_DEPARTMENT_OTHER): Payer: BC Managed Care – PPO | Admitting: Lab

## 2011-01-14 ENCOUNTER — Encounter: Payer: Self-pay | Admitting: Oncology

## 2011-01-14 VITALS — BP 126/75 | HR 105 | Temp 97.0°F

## 2011-01-14 VITALS — BP 138/79 | HR 102 | Temp 97.9°F | Ht 71.5 in | Wt 204.9 lb

## 2011-01-14 DIAGNOSIS — C8590 Non-Hodgkin lymphoma, unspecified, unspecified site: Secondary | ICD-10-CM

## 2011-01-14 DIAGNOSIS — I82609 Acute embolism and thrombosis of unspecified veins of unspecified upper extremity: Secondary | ICD-10-CM

## 2011-01-14 DIAGNOSIS — C8589 Other specified types of non-Hodgkin lymphoma, extranodal and solid organ sites: Secondary | ICD-10-CM

## 2011-01-14 DIAGNOSIS — Z5111 Encounter for antineoplastic chemotherapy: Secondary | ICD-10-CM

## 2011-01-14 DIAGNOSIS — Z5181 Encounter for therapeutic drug level monitoring: Secondary | ICD-10-CM

## 2011-01-14 DIAGNOSIS — Z7901 Long term (current) use of anticoagulants: Secondary | ICD-10-CM

## 2011-01-14 DIAGNOSIS — I82629 Acute embolism and thrombosis of deep veins of unspecified upper extremity: Secondary | ICD-10-CM

## 2011-01-14 DIAGNOSIS — Z5112 Encounter for antineoplastic immunotherapy: Secondary | ICD-10-CM

## 2011-01-14 LAB — COMPREHENSIVE METABOLIC PANEL
ALT: 65 U/L — ABNORMAL HIGH (ref 0–53)
AST: 34 U/L (ref 0–37)
Albumin: 3.5 g/dL (ref 3.5–5.2)
Alkaline Phosphatase: 77 U/L (ref 39–117)
BUN: 11 mg/dL (ref 6–23)
CO2: 30 mEq/L (ref 19–32)
Calcium: 9.8 mg/dL (ref 8.4–10.5)
Chloride: 99 mEq/L (ref 96–112)
Creatinine, Ser: 0.97 mg/dL (ref 0.50–1.35)
Glucose, Bld: 156 mg/dL — ABNORMAL HIGH (ref 70–99)
Potassium: 3.7 mEq/L (ref 3.5–5.3)
Sodium: 137 mEq/L (ref 135–145)
Total Bilirubin: 0.5 mg/dL (ref 0.3–1.2)
Total Protein: 7.1 g/dL (ref 6.0–8.3)

## 2011-01-14 LAB — CBC WITH DIFFERENTIAL/PLATELET
BASO%: 0.8 % (ref 0.0–2.0)
Basophils Absolute: 0 10*3/uL (ref 0.0–0.1)
EOS%: 0.2 % (ref 0.0–7.0)
Eosinophils Absolute: 0 10*3/uL (ref 0.0–0.5)
HCT: 43.5 % (ref 38.4–49.9)
HGB: 15.4 g/dL (ref 13.0–17.1)
LYMPH%: 15.4 % (ref 14.0–49.0)
MCH: 31.7 pg (ref 27.2–33.4)
MCHC: 35.4 g/dL (ref 32.0–36.0)
MCV: 89.5 fL (ref 79.3–98.0)
MONO#: 0.5 10*3/uL (ref 0.1–0.9)
MONO%: 9.6 % (ref 0.0–14.0)
NEUT#: 3.5 10*3/uL (ref 1.5–6.5)
NEUT%: 74 % (ref 39.0–75.0)
Platelets: 248 10*3/uL (ref 140–400)
RBC: 4.86 10*6/uL (ref 4.20–5.82)
RDW: 16.6 % — ABNORMAL HIGH (ref 11.0–14.6)
WBC: 4.8 10*3/uL (ref 4.0–10.3)
lymph#: 0.7 10*3/uL — ABNORMAL LOW (ref 0.9–3.3)
nRBC: 0 % (ref 0–0)

## 2011-01-14 LAB — PROTIME-INR
INR: 2.2 (ref 2.00–3.50)
Protime: 26.4 Seconds — ABNORMAL HIGH (ref 10.6–13.4)

## 2011-01-14 MED ORDER — SODIUM CHLORIDE 0.9 % IV SOLN: Freq: Once | INTRAVENOUS | Status: DC

## 2011-01-14 MED ORDER — ONDANSETRON 16 MG/50ML IVPB (CHCC)
16.0000 mg | Freq: Once | INTRAVENOUS | Status: AC
  Administered 2011-01-14: 16 mg via INTRAVENOUS

## 2011-01-14 MED ORDER — SODIUM CHLORIDE 0.9 % IV SOLN
375.0000 mg/m2 | Freq: Once | INTRAVENOUS | Status: AC
  Administered 2011-01-14: 800 mg via INTRAVENOUS
  Filled 2011-01-14: qty 80

## 2011-01-14 MED ORDER — SODIUM CHLORIDE 0.9 % IV SOLN
Freq: Once | INTRAVENOUS | Status: AC
  Administered 2011-01-14: 12:00:00 via INTRAVENOUS

## 2011-01-14 MED ORDER — PREDNISONE 20 MG PO TABS
80.0000 mg | ORAL_TABLET | Freq: Every day | ORAL | Status: AC
  Administered 2011-01-14: 80 mg via ORAL

## 2011-01-14 MED ORDER — SODIUM CHLORIDE 0.9 % IJ SOLN
2.0000 mg | Freq: Once | INTRAMUSCULAR | Status: AC
  Administered 2011-01-14: 2 mg via INTRAVENOUS
  Filled 2011-01-14: qty 2

## 2011-01-14 MED ORDER — SODIUM CHLORIDE 0.9 % IJ SOLN
3.0000 mL | INTRAMUSCULAR | Status: DC | PRN
  Filled 2011-01-14: qty 10

## 2011-01-14 MED ORDER — DOXORUBICIN HCL CHEMO IV INJECTION 2 MG/ML
47.0000 mg/m2 | Freq: Once | INTRAVENOUS | Status: AC
  Administered 2011-01-14: 102 mg via INTRAVENOUS
  Filled 2011-01-14: qty 51

## 2011-01-14 MED ORDER — SODIUM CHLORIDE 0.9 % IJ SOLN
10.0000 mL | INTRAMUSCULAR | Status: DC | PRN
  Administered 2011-01-14: 10 mL
  Filled 2011-01-14: qty 10

## 2011-01-14 MED ORDER — LORAZEPAM 2 MG/ML IJ SOLN
1.0000 mg | Freq: Once | INTRAMUSCULAR | Status: AC
  Administered 2011-01-14: 1 mg via INTRAVENOUS

## 2011-01-14 MED ORDER — HEPARIN SOD (PORK) LOCK FLUSH 100 UNIT/ML IV SOLN
250.0000 [IU] | Freq: Once | INTRAVENOUS | Status: DC | PRN
  Filled 2011-01-14: qty 5

## 2011-01-14 MED ORDER — ALTEPLASE 2 MG IJ SOLR
2.0000 mg | Freq: Once | INTRAMUSCULAR | Status: DC | PRN
  Filled 2011-01-14: qty 2

## 2011-01-14 MED ORDER — HEPARIN SOD (PORK) LOCK FLUSH 100 UNIT/ML IV SOLN
500.0000 [IU] | Freq: Once | INTRAVENOUS | Status: AC | PRN
  Administered 2011-01-14: 500 [IU]
  Filled 2011-01-14: qty 5

## 2011-01-14 MED ORDER — DIPHENHYDRAMINE HCL 25 MG PO CAPS
50.0000 mg | ORAL_CAPSULE | Freq: Once | ORAL | Status: AC
  Administered 2011-01-14: 50 mg via ORAL

## 2011-01-14 MED ORDER — SODIUM CHLORIDE 0.9 % IV SOLN
730.0000 mg/m2 | Freq: Once | INTRAVENOUS | Status: AC
  Administered 2011-01-14: 1560 mg via INTRAVENOUS
  Filled 2011-01-14: qty 78

## 2011-01-14 MED ORDER — ACETAMINOPHEN 325 MG PO TABS
650.0000 mg | ORAL_TABLET | Freq: Once | ORAL | Status: AC
  Administered 2011-01-14: 650 mg via ORAL

## 2011-01-14 NOTE — Progress Notes (Signed)
OFFICE PROGRESS NOTE   INTERVAL HISTORY:   [name] returns as scheduled. He completed a third cycle of CHOP/rituximab on October 19. He reports tolerating the chemotherapy well. He denies nausea, mouth sores, and neuropathy symptoms. He has a rare cough. He developed mild discomfort at the low back after Neulasta therapy. He continues Coumadin anti-coagulation.  Objective: Vital signs in last 24 hours:  Blood pressure 138/79, pulse 102, temperature 97.9 F (36.6 C), temperature source Oral, height 5' 11.5" (1.816 m), weight 204 lb 14.4 oz (92.942 kg).    HEENT: No thrush or ulcers  Resp: Lungs with decreased breath sounds at the left lower chest. No respiratory distress Cardio: Regular rate and rhythm. No gallop. GI: The abdomen is soft and nontender. No hepatosplenomegaly. Vascular: No leg or arm edema     Portacath/PICC-without erythema  Lab Results: Hemoglobin 15.4, platelets 248,000, white count 4.8, ANC 3.5 PT/INR 2.2    BMET   Basename 01/14/11 0941  NA 137  K 3.7  CL 99  CO2 30  GLUCOSE 156*  BUN 11  CREATININE 0.97  CALCIUM 9.8     Medications: I have reviewed the patient's current medications.  Assessment/Plan: 1. Diffuse large B-cell lymphoma involving a large anterior mediastinal mass, CD20 positive.  He is status post cycle 1 CHOP/Rituxan chemotherapy 11/13/2010 (Rituxan was given on 11/14/2010).  He completed cycle 2 CHOP/Rituxan 12/04/2010. He completed cycle #3 on October 19. 2. Cough secondary to pneumonitis and obstruction from the mediastinal/left upper chest mass, resolved..  3. Left upper extremity deep vein thrombosis diagnosed while hospitalized September 2012.  He will continue Coumadin 5 mg daily.  4. Postobstructive pneumonitis, status post Avelox therapy.  5. Fever/sweats secondary to number 1, resolved.  6. Status post placement of a right PICC line.  The PICC was removed on 11/25/2010.  7. Status post Port-A-Cath placement 11/30/2010.    8. History of tachycardia.  An echocardiogram was unremarkable.  The tachycardia continues to be improved.    Disposition:  The patient has completed 3 cycles of CHOP/rituximab. He continues to tolerate the chemotherapy well. The plan is to proceed with cycle #4 today.  [name] will undergo a restaging PET scan on 02/01/2011. He is scheduled for office visit and cycle #5 of chemotherapy on 02/05/2011.   [name] 01/14/2011, 2:09 PM

## 2011-01-14 NOTE — Progress Notes (Signed)
Rapid Rituxan started at 156ml/hr x19mins. Pt. And v/s stable at 1514hr, Rituxan rate increase to 200mg /hr until completion.

## 2011-01-14 NOTE — Telephone Encounter (Signed)
gve the pt his nov,dec 2012 appt calendar °

## 2011-01-14 NOTE — Patient Instructions (Signed)
[facility] Discharge Instructions for Patients Receiving Chemotherapy  Today you received the following chemotherapy agents adriamycin, cytoxan, vincristine, rituxan  To help prevent nausea and vomiting after your treatment, we encourage you to take your nausea medication as directed.  Begin taking it  as prescribed for the next 48 hours.   If you develop nausea and vomiting that is not controlled by your nausea medication, call the clinic. If it is after clinic hours your family physician or the after hours number for the clinic or go to the Emergency Department.   BELOW ARE SYMPTOMS THAT SHOULD BE REPORTED IMMEDIATELY:  *FEVER GREATER THAN 100.5 F  *CHILLS WITH OR WITHOUT FEVER  NAUSEA AND VOMITING THAT IS NOT CONTROLLED WITH YOUR NAUSEA MEDICATION  *UNUSUAL SHORTNESS OF BREATH  *UNUSUAL BRUISING OR BLEEDING  TENDERNESS IN MOUTH AND THROAT WITH OR WITHOUT PRESENCE OF ULCERS  *URINARY PROBLEMS  *BOWEL PROBLEMS  UNUSUAL RASH Items with * indicate a potential emergency and should be followed up as soon as possible.  One of the nurses will contact you 24 hours after your treatment. Please let the nurse know about any problems that you may have experienced. Feel free to call the clinic you have any questions or concerns. The clinic phone number is [phone].   I have been informed and understand all the instructions given to me. I know to contact the clinic, my physician, or go to the Emergency Department if any problems should occur. I do not have any questions at this time, but understand that I may call the clinic during office hours   should I have any questions or need assistance in obtaining follow up care.    __________________________________________  _____________  __________ Signature of Patient or Authorized Representative            Date                   Time    __________________________________________ Nurse's Signature

## 2011-01-15 ENCOUNTER — Ambulatory Visit (HOSPITAL_BASED_OUTPATIENT_CLINIC_OR_DEPARTMENT_OTHER): Payer: BC Managed Care – PPO

## 2011-01-15 ENCOUNTER — Other Ambulatory Visit: Payer: BC Managed Care – PPO

## 2011-01-15 VITALS — BP 125/75 | HR 95 | Temp 97.8°F

## 2011-01-15 DIAGNOSIS — C8589 Other specified types of non-Hodgkin lymphoma, extranodal and solid organ sites: Secondary | ICD-10-CM

## 2011-01-15 DIAGNOSIS — C8590 Non-Hodgkin lymphoma, unspecified, unspecified site: Secondary | ICD-10-CM

## 2011-01-15 MED ORDER — PEGFILGRASTIM INJECTION 6 MG/0.6ML
6.0000 mg | Freq: Once | SUBCUTANEOUS | Status: AC
  Administered 2011-01-15: 6 mg via SUBCUTANEOUS
  Filled 2011-01-15: qty 0.6

## 2011-01-18 ENCOUNTER — Telehealth: Payer: Self-pay | Admitting: *Deleted

## 2011-01-18 DIAGNOSIS — R12 Heartburn: Secondary | ICD-10-CM

## 2011-01-18 DIAGNOSIS — C8590 Non-Hodgkin lymphoma, unspecified, unspecified site: Secondary | ICD-10-CM

## 2011-01-18 MED ORDER — PANTOPRAZOLE SODIUM 40 MG PO TBEC: 40.0000 mg | DELAYED_RELEASE_TABLET | Freq: Every day | ORAL | Status: DC

## 2011-01-18 NOTE — Telephone Encounter (Signed)
Dr. Truett Perna approves order for Protonix  40 mg daily. Patient notified.

## 2011-01-18 NOTE — Telephone Encounter (Signed)
Requesting script for heartburn unresponsive to OTC antacids over weekend. Informed him this could be related to the Prednisone.

## 2011-01-29 ENCOUNTER — Encounter: Payer: Self-pay | Admitting: *Deleted

## 2011-01-29 ENCOUNTER — Other Ambulatory Visit: Payer: Self-pay | Admitting: Oncology

## 2011-01-29 ENCOUNTER — Telehealth: Payer: Self-pay | Admitting: *Deleted

## 2011-01-29 ENCOUNTER — Other Ambulatory Visit (HOSPITAL_BASED_OUTPATIENT_CLINIC_OR_DEPARTMENT_OTHER): Payer: BC Managed Care – PPO | Admitting: Lab

## 2011-01-29 DIAGNOSIS — Z5181 Encounter for therapeutic drug level monitoring: Secondary | ICD-10-CM

## 2011-01-29 DIAGNOSIS — C8589 Other specified types of non-Hodgkin lymphoma, extranodal and solid organ sites: Secondary | ICD-10-CM

## 2011-01-29 DIAGNOSIS — C8590 Non-Hodgkin lymphoma, unspecified, unspecified site: Secondary | ICD-10-CM

## 2011-01-29 LAB — CBC WITH DIFFERENTIAL/PLATELET
BASO%: 0.7 % (ref 0.0–2.0)
Basophils Absolute: 0 10*3/uL (ref 0.0–0.1)
EOS%: 0.4 % (ref 0.0–7.0)
Eosinophils Absolute: 0 10*3/uL (ref 0.0–0.5)
HCT: 42.6 % (ref 38.4–49.9)
HGB: 14.8 g/dL (ref 13.0–17.1)
LYMPH%: 13.8 % — ABNORMAL LOW (ref 14.0–49.0)
MCH: 32.3 pg (ref 27.2–33.4)
MCHC: 34.8 g/dL (ref 32.0–36.0)
MCV: 93 fL (ref 79.3–98.0)
MONO#: 0.7 10*3/uL (ref 0.1–0.9)
MONO%: 12.1 % (ref 0.0–14.0)
NEUT#: 4.1 10*3/uL (ref 1.5–6.5)
NEUT%: 73 % (ref 39.0–75.0)
Platelets: 300 10*3/uL (ref 140–400)
RBC: 4.58 10*6/uL (ref 4.20–5.82)
RDW: 17.5 % — ABNORMAL HIGH (ref 11.0–14.6)
WBC: 5.6 10*3/uL (ref 4.0–10.3)
lymph#: 0.8 10*3/uL — ABNORMAL LOW (ref 0.9–3.3)

## 2011-01-29 LAB — COMPREHENSIVE METABOLIC PANEL
ALT: 47 U/L (ref 0–53)
AST: 19 U/L (ref 0–37)
Albumin: 4.1 g/dL (ref 3.5–5.2)
Alkaline Phosphatase: 71 U/L (ref 39–117)
BUN: 10 mg/dL (ref 6–23)
CO2: 28 mEq/L (ref 19–32)
Calcium: 9.7 mg/dL (ref 8.4–10.5)
Chloride: 101 mEq/L (ref 96–112)
Creatinine, Ser: 0.84 mg/dL (ref 0.50–1.35)
Glucose, Bld: 90 mg/dL (ref 70–99)
Potassium: 4 mEq/L (ref 3.5–5.3)
Sodium: 138 mEq/L (ref 135–145)
Total Bilirubin: 0.2 mg/dL — ABNORMAL LOW (ref 0.3–1.2)
Total Protein: 6.8 g/dL (ref 6.0–8.3)

## 2011-01-29 LAB — PROTIME-INR
INR: 2.4 (ref 2.00–3.50)
Protime: 28.8 Seconds — ABNORMAL HIGH (ref 10.6–13.4)

## 2011-01-29 NOTE — Telephone Encounter (Signed)
Spoke with pt. & informed to stay on same dose of coumadin = 5mg /d.

## 2011-01-30 ENCOUNTER — Other Ambulatory Visit: Payer: Self-pay | Admitting: Internal Medicine

## 2011-01-30 DIAGNOSIS — I82629 Acute embolism and thrombosis of deep veins of unspecified upper extremity: Secondary | ICD-10-CM

## 2011-01-30 MED ORDER — WARFARIN SODIUM 5 MG PO TABS: 5.0000 mg | ORAL_TABLET | Freq: Every day | ORAL | Status: DC

## 2011-02-01 ENCOUNTER — Encounter (HOSPITAL_COMMUNITY)
Admission: RE | Admit: 2011-02-01 | Discharge: 2011-02-01 | Disposition: A | Discharge status: Home / Self Care | Payer: BC Managed Care – PPO | Source: Ambulatory Visit | Attending: Oncology | Admitting: Oncology

## 2011-02-01 DIAGNOSIS — C8590 Non-Hodgkin lymphoma, unspecified, unspecified site: Secondary | ICD-10-CM

## 2011-02-02 ENCOUNTER — Telehealth: Payer: Self-pay | Admitting: *Deleted

## 2011-02-02 ENCOUNTER — Encounter (HOSPITAL_COMMUNITY)
Admission: RE | Admit: 2011-02-02 | Discharge: 2011-02-02 | Disposition: A | Discharge status: Home / Self Care | Payer: BC Managed Care – PPO | Source: Ambulatory Visit | Attending: Oncology | Admitting: Oncology

## 2011-02-02 DIAGNOSIS — C8589 Other specified types of non-Hodgkin lymphoma, extranodal and solid organ sites: Secondary | ICD-10-CM | POA: Insufficient documentation

## 2011-02-02 DIAGNOSIS — Z79899 Other long term (current) drug therapy: Secondary | ICD-10-CM | POA: Insufficient documentation

## 2011-02-02 LAB — GLUCOSE, CAPILLARY: Glucose-Capillary: 115 mg/dL — ABNORMAL HIGH (ref 70–99)

## 2011-02-02 MED ORDER — FLUDEOXYGLUCOSE F - 18 (FDG) INJECTION
19.0000 | Freq: Once | INTRAVENOUS | Status: AC | PRN
  Administered 2011-02-02: 19 via INTRAVENOUS

## 2011-02-02 NOTE — Telephone Encounter (Signed)
Per Dr. Casimer Lanius same Coumadin and follow up as scheduled.

## 2011-02-03 ENCOUNTER — Telehealth: Payer: Self-pay | Admitting: *Deleted

## 2011-02-03 DIAGNOSIS — F419 Anxiety disorder, unspecified: Secondary | ICD-10-CM

## 2011-02-03 MED ORDER — LORAZEPAM 1 MG PO TABS: 1.0000 mg | ORAL_TABLET | Freq: Four times a day (QID) | ORAL | Status: AC | PRN

## 2011-02-03 NOTE — Telephone Encounter (Signed)
Patient called to request med for anxiety--especially nervous and anxious day of his chemo and when on Prednisone. Has has "sinus infection"--green drainage despite using OTC Mucinex D and Nyquil. Temp 99.2 maximum. Per Dr. Olga Millers call in Ativan 1mg  every 6 hours prn and continue OTC meds. No antibiotic if he is not febrile. Call for fever. Patient understands and agrees. Will follow up on Friday as scheduled.

## 2011-02-05 ENCOUNTER — Ambulatory Visit (HOSPITAL_BASED_OUTPATIENT_CLINIC_OR_DEPARTMENT_OTHER): Payer: BC Managed Care – PPO

## 2011-02-05 ENCOUNTER — Other Ambulatory Visit (HOSPITAL_BASED_OUTPATIENT_CLINIC_OR_DEPARTMENT_OTHER): Payer: BC Managed Care – PPO | Admitting: Lab

## 2011-02-05 ENCOUNTER — Other Ambulatory Visit: Payer: Self-pay | Admitting: *Deleted

## 2011-02-05 ENCOUNTER — Telehealth: Payer: Self-pay | Admitting: Oncology

## 2011-02-05 ENCOUNTER — Ambulatory Visit (HOSPITAL_BASED_OUTPATIENT_CLINIC_OR_DEPARTMENT_OTHER): Payer: BC Managed Care – PPO | Admitting: Oncology

## 2011-02-05 VITALS — BP 124/76 | HR 112 | Temp 97.1°F

## 2011-02-05 DIAGNOSIS — C8590 Non-Hodgkin lymphoma, unspecified, unspecified site: Secondary | ICD-10-CM

## 2011-02-05 DIAGNOSIS — I82629 Acute embolism and thrombosis of deep veins of unspecified upper extremity: Secondary | ICD-10-CM

## 2011-02-05 DIAGNOSIS — Z5112 Encounter for antineoplastic immunotherapy: Secondary | ICD-10-CM

## 2011-02-05 DIAGNOSIS — Z5111 Encounter for antineoplastic chemotherapy: Secondary | ICD-10-CM

## 2011-02-05 DIAGNOSIS — C8589 Other specified types of non-Hodgkin lymphoma, extranodal and solid organ sites: Secondary | ICD-10-CM

## 2011-02-05 LAB — CBC WITH DIFFERENTIAL/PLATELET
BASO%: 0.5 % (ref 0.0–2.0)
Basophils Absolute: 0 10*3/uL (ref 0.0–0.1)
EOS%: 0.5 % (ref 0.0–7.0)
Eosinophils Absolute: 0 10*3/uL (ref 0.0–0.5)
HCT: 44.5 % (ref 38.4–49.9)
HGB: 15.7 g/dL (ref 13.0–17.1)
LYMPH%: 10.4 % — ABNORMAL LOW (ref 14.0–49.0)
MCH: 32.2 pg (ref 27.2–33.4)
MCHC: 35.3 g/dL (ref 32.0–36.0)
MCV: 91.4 fL (ref 79.3–98.0)
MONO#: 0.6 10*3/uL (ref 0.1–0.9)
MONO%: 8 % (ref 0.0–14.0)
NEUT#: 5.9 10*3/uL (ref 1.5–6.5)
NEUT%: 80.6 % — ABNORMAL HIGH (ref 39.0–75.0)
Platelets: 305 10*3/uL (ref 140–400)
RBC: 4.87 10*6/uL (ref 4.20–5.82)
RDW: 15.5 % — ABNORMAL HIGH (ref 11.0–14.6)
WBC: 7.3 10*3/uL (ref 4.0–10.3)
lymph#: 0.8 10*3/uL — ABNORMAL LOW (ref 0.9–3.3)
nRBC: 0 % (ref 0–0)

## 2011-02-05 LAB — PROTIME-INR
INR: 2.4 (ref 2.00–3.50)
Protime: 28.8 Seconds — ABNORMAL HIGH (ref 10.6–13.4)

## 2011-02-05 MED ORDER — PALONOSETRON HCL INJECTION 0.25 MG/5ML
0.2500 mg | Freq: Once | INTRAVENOUS | Status: AC
  Administered 2011-02-05: 0.25 mg via INTRAVENOUS

## 2011-02-05 MED ORDER — DIPHENHYDRAMINE HCL 25 MG PO CAPS
50.0000 mg | ORAL_CAPSULE | Freq: Once | ORAL | Status: AC
  Administered 2011-02-05: 50 mg via ORAL

## 2011-02-05 MED ORDER — SODIUM CHLORIDE 0.9 % IV SOLN: Freq: Once | INTRAVENOUS | Status: DC

## 2011-02-05 MED ORDER — VINCRISTINE SULFATE CHEMO INJECTION 1 MG/ML
2.0000 mg | Freq: Once | INTRAVENOUS | Status: AC
  Administered 2011-02-05: 2 mg via INTRAVENOUS
  Filled 2011-02-05: qty 2

## 2011-02-05 MED ORDER — OXYCODONE-ACETAMINOPHEN 5-325 MG PO TABS: 1.0000 | ORAL_TABLET | ORAL | Status: DC | PRN

## 2011-02-05 MED ORDER — SODIUM CHLORIDE 0.9 % IV SOLN
730.0000 mg/m2 | Freq: Once | INTRAVENOUS | Status: AC
  Administered 2011-02-05: 1560 mg via INTRAVENOUS
  Filled 2011-02-05: qty 78

## 2011-02-05 MED ORDER — DOXORUBICIN HCL CHEMO IV INJECTION 2 MG/ML
47.0000 mg/m2 | Freq: Once | INTRAVENOUS | Status: AC
  Administered 2011-02-05: 102 mg via INTRAVENOUS
  Filled 2011-02-05: qty 51

## 2011-02-05 MED ORDER — SODIUM CHLORIDE 0.9 % IJ SOLN
10.0000 mL | INTRAMUSCULAR | Status: DC | PRN
  Administered 2011-02-05: 10 mL
  Filled 2011-02-05: qty 10

## 2011-02-05 MED ORDER — HEPARIN SOD (PORK) LOCK FLUSH 100 UNIT/ML IV SOLN
500.0000 [IU] | Freq: Once | INTRAVENOUS | Status: AC | PRN
  Administered 2011-02-05: 500 [IU]
  Filled 2011-02-05: qty 5

## 2011-02-05 MED ORDER — PREDNISONE 20 MG PO TABS: 100.0000 mg | ORAL_TABLET | Freq: Every day | ORAL | Status: DC

## 2011-02-05 MED ORDER — PREDNISONE 20 MG PO TABS
80.0000 mg | ORAL_TABLET | Freq: Every day | ORAL | Status: DC
  Administered 2011-02-05: 80 mg via ORAL

## 2011-02-05 MED ORDER — ACETAMINOPHEN 325 MG PO TABS
650.0000 mg | ORAL_TABLET | Freq: Once | ORAL | Status: AC
  Administered 2011-02-05: 650 mg via ORAL

## 2011-02-05 MED ORDER — SODIUM CHLORIDE 0.9 % IV SOLN
375.0000 mg/m2 | Freq: Once | INTRAVENOUS | Status: AC
  Administered 2011-02-05: 800 mg via INTRAVENOUS
  Filled 2011-02-05: qty 80

## 2011-02-05 NOTE — Telephone Encounter (Signed)
gv pt appt schedule for dec.  °

## 2011-02-05 NOTE — Patient Instructions (Signed)
1615 Pt verbalized understanding on next appt date/time

## 2011-02-05 NOTE — Progress Notes (Signed)
OFFICE PROGRESS NOTE   INTERVAL HISTORY:   He returns as scheduled. He had several episodes of nausea and vomiting over the 2 days following the last cycle of chemotherapy. He feels well in general He has developed sinus drainage this week. He denies shortness of breath. He does not have a significant cough or fever. He denies neuropathy symptoms.  Objective:  Vital signs in last 24 hours:  Blood pressure 137/75, pulse 120, temperature 97.1 F (36.2 C), temperature source Oral, height 5' 11.5" (1.816 m), weight 202 lb 4.8 oz (91.763 kg).    HEENT: Near total alopecia. No thrush or ulcers. Pharynx without erythema Resp: Lungs clear bilaterally. Slight decrease in breath sounds at the left lower chest. No respiratory distress. Cardio: Regular rate and rhythm GI: Abdomen is soft and nontender. No hepatosplenomegaly Vascular: No leg edema. No arm edema.     Portacath/PICC-without erythema  Lab Results:  CBC  Lab Results  Component Value Date   WBC 7.3 02/05/2011   HGB 15.7 02/05/2011   HCT 44.5 02/05/2011   MCV 91.4 02/05/2011   PLT 305 02/05/2011    Chemistry:      Component Value Date/Time   NA 138 01/29/2011 1153   K 4.0 01/29/2011 1153   CL 101 01/29/2011 1153   CO2 28 01/29/2011 1153   GLUCOSE 90 01/29/2011 1153   BUN 10 01/29/2011 1153   CREATININE 0.84 01/29/2011 1153   CALCIUM 9.7 01/29/2011 1153   PROT 6.8 01/29/2011 1153   ALBUMIN 4.1 01/29/2011 1153   AST 19 01/29/2011 1153   ALT 47 01/29/2011 1153   ALKPHOS 71 01/29/2011 1153   BILITOT 0.2* 01/29/2011 1153   GFRNONAA >60 11/26/2010 1345   GFRAA >60 11/26/2010 1345      Studies/Results: Restaging PET scan on November 27-there is been a marketed decrease in the size of the anterior mediastinal mass. The dominant residual mass now measures 3.8 x 2.6 cm with an SUV of 4.3. On the previous exam the mass measured 8 cm with an SUV max of 36.9. Smaller foci of anterior mediastinal hypermetabolism Resolved.  No extra osseous hypermetabolism within the abdomen or pelvis. There is diffuse marrow hyper metabolism. I reviewed the restaging PET scan with the patient and his parents  Medications: I have reviewed the patient's current medications.  Assessment/Plan: 1. Diffuse large B-cell lymphoma involving a large anterior mediastinal mass, CD20 positive. He is status post cycle 1 CHOP/Rituxan chemotherapy 11/13/2010 (Rituxan was given on 11/14/2010). He completed cycle 2 CHOP/Rituxan 12/04/2010. He completed cycle #3 on October 19. He completed cycle #4 on 01/14/2011                -a restaging PET scan on November 27 shows a marketed improvement in the anterior mediastinal mass/hypermetabolic activity with a small remaining mediastinal mass with mildly increased FDG activity. 2. Cough secondary to pneumonitis and obstruction from the mediastinal/left upper chest mass, resolved..  3. Left upper extremity deep vein thrombosis diagnosed while hospitalized September 2012. He will continue Coumadin 5 mg daily. The PT INR is therapeutic 4. Postobstructive pneumonitis, status post Avelox therapy.  5. Fever/sweats secondary to number 1, resolved.  6. Status post placement of a right PICC line. The PICC was removed on 11/25/2010.  7. Status post Port-A-Cath placement 11/30/2010.  8. History of tachycardia. An echocardiogram was unremarkable. The tachycardia continues to be improved.  9. Current upper respiratory infection, likely viral   Disposition:  He has completed 4 cycles of systemic therapy  with CHOP/rituximab. His clinical status is markedly improved and he restaging PET scan confirms a near complete radiologic response. I reviewed the PET scan and discussed treatment options with the patient and his family. I discussed the case with my colleagues and a radiation oncologist. We decided to proceed with 2 more cycles of CHOP/rituximab. We will then consider the indication for radiation consolidation. We will  consider a restaging PET scan after 2 more cycles of systemic therapy.  He had significant nausea following cycle #4. We changed the anti-medic premedication to Aloxi. He will use Compazine and Ativan as needed. He will contact us for significant nausea following this cycle.  He'll return for an office visit and cycle #6 in 3 weeks.     cycle Debera Lat, MD  02/05/2011  5:44 PM

## 2011-02-06 ENCOUNTER — Telehealth: Payer: Self-pay | Admitting: Oncology

## 2011-02-06 ENCOUNTER — Ambulatory Visit (HOSPITAL_BASED_OUTPATIENT_CLINIC_OR_DEPARTMENT_OTHER): Payer: BC Managed Care – PPO

## 2011-02-06 DIAGNOSIS — C8589 Other specified types of non-Hodgkin lymphoma, extranodal and solid organ sites: Secondary | ICD-10-CM

## 2011-02-06 DIAGNOSIS — Z5189 Encounter for other specified aftercare: Secondary | ICD-10-CM

## 2011-02-06 DIAGNOSIS — R0789 Other chest pain: Secondary | ICD-10-CM

## 2011-02-06 DIAGNOSIS — R002 Palpitations: Secondary | ICD-10-CM

## 2011-02-06 DIAGNOSIS — C8590 Non-Hodgkin lymphoma, unspecified, unspecified site: Secondary | ICD-10-CM

## 2011-02-06 DIAGNOSIS — D4989 Neoplasm of unspecified behavior of other specified sites: Secondary | ICD-10-CM

## 2011-02-06 DIAGNOSIS — I82629 Acute embolism and thrombosis of deep veins of unspecified upper extremity: Secondary | ICD-10-CM

## 2011-02-06 DIAGNOSIS — R0602 Shortness of breath: Secondary | ICD-10-CM

## 2011-02-06 MED ORDER — PEGFILGRASTIM INJECTION 6 MG/0.6ML
6.0000 mg | Freq: Once | SUBCUTANEOUS | Status: AC
  Administered 2011-02-06: 6 mg via SUBCUTANEOUS

## 2011-02-06 NOTE — Telephone Encounter (Signed)
lmonvm for nurse re order for pt/inr next week. Pt was given dec schedule prior to leaving yesterday but says he needs lb next wk.

## 2011-02-16 ENCOUNTER — Other Ambulatory Visit: Payer: Self-pay | Admitting: Certified Registered Nurse Anesthetist

## 2011-02-26 ENCOUNTER — Other Ambulatory Visit (HOSPITAL_BASED_OUTPATIENT_CLINIC_OR_DEPARTMENT_OTHER): Payer: BC Managed Care – PPO | Admitting: Lab

## 2011-02-26 ENCOUNTER — Ambulatory Visit (HOSPITAL_BASED_OUTPATIENT_CLINIC_OR_DEPARTMENT_OTHER): Payer: BC Managed Care – PPO

## 2011-02-26 ENCOUNTER — Other Ambulatory Visit: Payer: Self-pay | Admitting: *Deleted

## 2011-02-26 ENCOUNTER — Ambulatory Visit (HOSPITAL_COMMUNITY)
Admission: RE | Admit: 2011-02-26 | Discharge: 2011-02-26 | Disposition: A | Discharge status: Home / Self Care | Payer: BC Managed Care – PPO | Source: Ambulatory Visit | Attending: Oncology | Admitting: Oncology

## 2011-02-26 ENCOUNTER — Ambulatory Visit (HOSPITAL_BASED_OUTPATIENT_CLINIC_OR_DEPARTMENT_OTHER): Payer: BC Managed Care – PPO | Admitting: Oncology

## 2011-02-26 VITALS — BP 109/68 | HR 94 | Temp 98.4°F

## 2011-02-26 DIAGNOSIS — C8589 Other specified types of non-Hodgkin lymphoma, extranodal and solid organ sites: Secondary | ICD-10-CM

## 2011-02-26 DIAGNOSIS — C8590 Non-Hodgkin lymphoma, unspecified, unspecified site: Secondary | ICD-10-CM

## 2011-02-26 DIAGNOSIS — Z87898 Personal history of other specified conditions: Secondary | ICD-10-CM | POA: Insufficient documentation

## 2011-02-26 DIAGNOSIS — I82629 Acute embolism and thrombosis of deep veins of unspecified upper extremity: Secondary | ICD-10-CM

## 2011-02-26 DIAGNOSIS — I809 Phlebitis and thrombophlebitis of unspecified site: Secondary | ICD-10-CM

## 2011-02-26 DIAGNOSIS — Z5112 Encounter for antineoplastic immunotherapy: Secondary | ICD-10-CM

## 2011-02-26 DIAGNOSIS — C781 Secondary malignant neoplasm of mediastinum: Secondary | ICD-10-CM

## 2011-02-26 DIAGNOSIS — R059 Cough, unspecified: Secondary | ICD-10-CM | POA: Insufficient documentation

## 2011-02-26 DIAGNOSIS — R05 Cough: Secondary | ICD-10-CM | POA: Insufficient documentation

## 2011-02-26 DIAGNOSIS — Z79899 Other long term (current) drug therapy: Secondary | ICD-10-CM

## 2011-02-26 DIAGNOSIS — Z5111 Encounter for antineoplastic chemotherapy: Secondary | ICD-10-CM

## 2011-02-26 LAB — COMPREHENSIVE METABOLIC PANEL
ALT: 54 U/L — ABNORMAL HIGH (ref 0–53)
AST: 26 U/L (ref 0–37)
Albumin: 4.1 g/dL (ref 3.5–5.2)
Alkaline Phosphatase: 76 U/L (ref 39–117)
BUN: 11 mg/dL (ref 6–23)
CO2: 27 mEq/L (ref 19–32)
Calcium: 9.3 mg/dL (ref 8.4–10.5)
Chloride: 100 mEq/L (ref 96–112)
Creatinine, Ser: 0.79 mg/dL (ref 0.50–1.35)
Glucose, Bld: 92 mg/dL (ref 70–99)
Potassium: 3.7 mEq/L (ref 3.5–5.3)
Sodium: 136 mEq/L (ref 135–145)
Total Bilirubin: 0.2 mg/dL — ABNORMAL LOW (ref 0.3–1.2)
Total Protein: 6.8 g/dL (ref 6.0–8.3)

## 2011-02-26 LAB — CBC WITH DIFFERENTIAL/PLATELET
BASO%: 0.9 % (ref 0.0–2.0)
Basophils Absolute: 0 10*3/uL (ref 0.0–0.1)
EOS%: 0.5 % (ref 0.0–7.0)
Eosinophils Absolute: 0 10*3/uL (ref 0.0–0.5)
HCT: 42.3 % (ref 38.4–49.9)
HGB: 14.7 g/dL (ref 13.0–17.1)
LYMPH%: 13 % — ABNORMAL LOW (ref 14.0–49.0)
MCH: 32 pg (ref 27.2–33.4)
MCHC: 34.8 g/dL (ref 32.0–36.0)
MCV: 92.2 fL (ref 79.3–98.0)
MONO#: 0.6 10*3/uL (ref 0.1–0.9)
MONO%: 13.7 % (ref 0.0–14.0)
NEUT#: 3.1 10*3/uL (ref 1.5–6.5)
NEUT%: 71.9 % (ref 39.0–75.0)
Platelets: 249 10*3/uL (ref 140–400)
RBC: 4.59 10*6/uL (ref 4.20–5.82)
RDW: 14.6 % (ref 11.0–14.6)
WBC: 4.2 10*3/uL (ref 4.0–10.3)
lymph#: 0.6 10*3/uL — ABNORMAL LOW (ref 0.9–3.3)
nRBC: 0 % (ref 0–0)

## 2011-02-26 LAB — LACTATE DEHYDROGENASE: LDH: 176 U/L (ref 94–250)

## 2011-02-26 LAB — PROTIME-INR
INR: 2 (ref 2.00–3.50)
Protime: 24 Seconds — ABNORMAL HIGH (ref 10.6–13.4)

## 2011-02-26 MED ORDER — SODIUM CHLORIDE 0.9 % IV SOLN: Freq: Once | INTRAVENOUS | Status: DC

## 2011-02-26 MED ORDER — DIPHENHYDRAMINE HCL 25 MG PO CAPS
50.0000 mg | ORAL_CAPSULE | Freq: Once | ORAL | Status: AC
  Administered 2011-02-26: 50 mg via ORAL

## 2011-02-26 MED ORDER — WARFARIN SODIUM 5 MG PO TABS: 5.0000 mg | ORAL_TABLET | Freq: Every day | ORAL | Status: DC

## 2011-02-26 MED ORDER — VINCRISTINE SULFATE CHEMO INJECTION 1 MG/ML
2.0000 mg | Freq: Once | INTRAVENOUS | Status: AC
  Administered 2011-02-26: 2 mg via INTRAVENOUS
  Filled 2011-02-26: qty 2

## 2011-02-26 MED ORDER — DOXORUBICIN HCL CHEMO IV INJECTION 2 MG/ML
47.0000 mg/m2 | Freq: Once | INTRAVENOUS | Status: AC
  Administered 2011-02-26: 102 mg via INTRAVENOUS
  Filled 2011-02-26: qty 51

## 2011-02-26 MED ORDER — LORAZEPAM 2 MG/ML IJ SOLN
0.5000 mg | Freq: Once | INTRAMUSCULAR | Status: AC
  Administered 2011-02-26: 0.5 mg via INTRAVENOUS

## 2011-02-26 MED ORDER — SODIUM CHLORIDE 0.9 % IV SOLN
730.0000 mg/m2 | Freq: Once | INTRAVENOUS | Status: AC
  Administered 2011-02-26: 1560 mg via INTRAVENOUS
  Filled 2011-02-26: qty 78

## 2011-02-26 MED ORDER — ACETAMINOPHEN 325 MG PO TABS
650.0000 mg | ORAL_TABLET | Freq: Once | ORAL | Status: AC
Start: 2011-02-26 — End: 2011-02-26
  Administered 2011-02-26: 650 mg via ORAL

## 2011-02-26 MED ORDER — SODIUM CHLORIDE 0.9 % IV SOLN
375.0000 mg/m2 | Freq: Once | INTRAVENOUS | Status: AC
  Administered 2011-02-26: 800 mg via INTRAVENOUS
  Filled 2011-02-26: qty 80

## 2011-02-26 MED ORDER — PREDNISONE 50 MG PO TABS
100.0000 mg | ORAL_TABLET | Freq: Once | ORAL | Status: AC
Start: 2011-02-26 — End: 2011-02-26
  Administered 2011-02-26: 100 mg via ORAL

## 2011-02-26 MED ORDER — PALONOSETRON HCL INJECTION 0.25 MG/5ML
0.2500 mg | Freq: Once | INTRAVENOUS | Status: AC
  Administered 2011-02-26: 0.25 mg via INTRAVENOUS

## 2011-02-26 NOTE — Patient Instructions (Signed)
Patient ambulatory out of clinic with family member.  No complaints at time of discharge.  Instructed patient to call with any issues, patient expressed understanding.

## 2011-02-26 NOTE — Progress Notes (Signed)
Patient had one episode of vomiting after taking Benadryl tablets.  Aloxi given and Dr. Truett Perna notified of episode.  Orders received and implemented.

## 2011-02-26 NOTE — Progress Notes (Signed)
OFFICE PROGRESS NOTE   INTERVAL HISTORY:   He returns as scheduled. He completed another cycle of chemotherapy on November 30. He received Neulasta on December 1. He reports tolerating the chemotherapy well. He did not have significant bone pain with the Neulasta. He had an upper respiratory infection when he was here last month. The sinus symptoms have improved, but he continues to have an intermittent cough. He denies shortness of breath and fever. The cough has improved. The cough is productive of a "greenish "sputum. He denies neuropathy symptoms.  Objective:  Vital signs in last 24 hours:  Blood pressure 147/82, pulse 119, temperature 97 F (36.1 C), temperature source Oral, weight 201 lb 6.4 oz (91.354 kg).    HEENT: No thrush or ulcers. Neck without mass. Resp: Lungs clear bilaterally with decreased breath sounds at the left base. No respiratory distress. Cardio: Regular rate and rhythm. GI: No hepatosplenomegaly. Nontender. Vascular: No leg or arm edema.    Portacath/PICC-without erythema  Lab Results:  Lab Results  Component Value Date   WBC 4.2 02/26/2011   HGB 14.7 02/26/2011   HCT 42.3 02/26/2011   MCV 92.2 02/26/2011   PLT 249 02/26/2011   ANC 3.1 PT/INR 2.0   Medications: I have reviewed the patient's current medications.  Assessment/Plan: 1. Diffuse large B-cell lymphoma involving a large anterior mediastinal mass, CD20 positive. He is status post cycle 1 CHOP/Rituxan chemotherapy 11/13/2010 (Rituxan was given on 11/14/2010). He completed cycle 2 CHOP/Rituxan 12/04/2010. He completed cycle #3 on October 19. He completed cycle #4 on 01/14/2011 -a restaging PET scan on November 27 shows a marketed improvement in the anterior mediastinal mass/hypermetabolic activity with a small remaining mediastinal mass with mildly increased FDG activity.  he completed cycle 5 of CHOP/Rituxan on 02/05/2011. 2. Cough secondary to pneumonitis and obstruction from the  mediastinal/left upper chest mass, resolved..  3. Left upper extremity deep vein thrombosis diagnosed while hospitalized September 2012. He will continue Coumadin 5 mg daily. The PT INR is therapeutic 4. Postobstructive pneumonitis, status post Avelox therapy.  5. Fever/sweats secondary to number 1, resolved.  6. Status post placement of a right PICC line. The PICC was removed on 11/25/2010.  7. Status post Port-A-Cath placement 11/30/2010.  8. History of tachycardia. An echocardiogram was unremarkable. The tachycardia persists and has improved 9. Upper respiratory infection when he was here on 02/05/2011-partially improved. He has a persistent cough. We will obtain a chest x-ray today.   Disposition:  He is scheduled for a final cycle of CHOP/Rituxan today. He continues to tolerate the chemotherapy well. I suspect the current cough is related to a resolving viral upper respiratory infection. We will check a chest x-ray to be sure he does not have pneumonia or progression of lymphoma.  We will make a referral to radiation oncology to consider the indication for radiation consolidation. Mr. Menna requests another medical oncology opinion. We will make a referral to Dr. Peggye Pitt at [facility].   Lucile Shutters, MD  02/26/2011  11:21 AM

## 2011-02-27 ENCOUNTER — Ambulatory Visit (HOSPITAL_BASED_OUTPATIENT_CLINIC_OR_DEPARTMENT_OTHER): Payer: BC Managed Care – PPO

## 2011-02-27 VITALS — BP 166/74 | HR 123 | Temp 97.8°F

## 2011-02-27 DIAGNOSIS — C8589 Other specified types of non-Hodgkin lymphoma, extranodal and solid organ sites: Secondary | ICD-10-CM

## 2011-02-27 DIAGNOSIS — Z5189 Encounter for other specified aftercare: Secondary | ICD-10-CM

## 2011-02-27 DIAGNOSIS — C8590 Non-Hodgkin lymphoma, unspecified, unspecified site: Secondary | ICD-10-CM

## 2011-02-27 MED ORDER — PEGFILGRASTIM INJECTION 6 MG/0.6ML
6.0000 mg | Freq: Once | SUBCUTANEOUS | Status: AC
  Administered 2011-02-27: 6 mg via SUBCUTANEOUS

## 2011-02-27 NOTE — Progress Notes (Signed)
Instructed to call for concerns -voices understanding.

## 2011-03-11 ENCOUNTER — Telehealth: Payer: Self-pay | Admitting: Oncology

## 2011-03-11 NOTE — Telephone Encounter (Signed)
Pt appt to see Dr. Wendee Copp @ [facility] is 04/06/11 @ 1:00. Pt is aware.

## 2011-03-15 ENCOUNTER — Encounter: Payer: Self-pay | Admitting: Radiation Oncology

## 2011-03-15 NOTE — Progress Notes (Signed)
31 year old male. Lives in [place]. Single. No children. 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   10/12/2010 CT showed a large irregular and heterogeneously enhancing mediastinal mass involving the anterior and middle mediastinum on the left side. 11/06/2010 mediastinotomy pathology showed non-Hodgkin lymphoma, diffuse large B-cell type CD20 positive. 11/11/10 PET confirmed the 12.1x8.6x12.2 cm mass to be intensely hypermetabolic. Restaging PET on 02/02/11 following 4 cycles of chemotherapy shows a marked improvement. He completed cycle 5 of CHOP/Rituxan on 02/05/11. Patient referred to radiation oncology for radiation consolidation. Also, patient referred to Dr. Peggye Pitt at [facility] because he requested another medical oncology opinion.   Ax: Levaquin causes palpitations No indications of a pacemaker No history of radiation therapy in the past

## 2011-03-16 ENCOUNTER — Ambulatory Visit
Admission: RE | Admit: 2011-03-16 | Discharge: 2011-03-16 | Disposition: A | Discharge status: Home / Self Care | Payer: BC Managed Care – PPO | Source: Ambulatory Visit | Attending: Radiation Oncology | Admitting: Radiation Oncology

## 2011-03-16 ENCOUNTER — Encounter: Payer: Self-pay | Admitting: Radiation Oncology

## 2011-03-16 ENCOUNTER — Telehealth: Payer: Self-pay | Admitting: *Deleted

## 2011-03-16 VITALS — BP 116/79 | HR 97 | Temp 97.6°F | Resp 20 | Ht 72.0 in | Wt 204.1 lb

## 2011-03-16 DIAGNOSIS — Z79899 Other long term (current) drug therapy: Secondary | ICD-10-CM | POA: Insufficient documentation

## 2011-03-16 DIAGNOSIS — Z510 Encounter for antineoplastic radiation therapy: Secondary | ICD-10-CM | POA: Insufficient documentation

## 2011-03-16 DIAGNOSIS — Z86718 Personal history of other venous thrombosis and embolism: Secondary | ICD-10-CM | POA: Insufficient documentation

## 2011-03-16 DIAGNOSIS — C8589 Other specified types of non-Hodgkin lymphoma, extranodal and solid organ sites: Secondary | ICD-10-CM | POA: Insufficient documentation

## 2011-03-16 DIAGNOSIS — K219 Gastro-esophageal reflux disease without esophagitis: Secondary | ICD-10-CM | POA: Insufficient documentation

## 2011-03-16 DIAGNOSIS — C8590 Non-Hodgkin lymphoma, unspecified, unspecified site: Secondary | ICD-10-CM

## 2011-03-16 DIAGNOSIS — Z7901 Long term (current) use of anticoagulants: Secondary | ICD-10-CM | POA: Insufficient documentation

## 2011-03-16 HISTORY — DX: Allergy, unspecified, initial encounter: T78.40XA

## 2011-03-16 NOTE — Progress Notes (Signed)
Please see the Nurse Progress Note in the MD Initial Consult Encounter for this patient. 

## 2011-03-16 NOTE — Progress Notes (Signed)
This encounter created in error - please disregard.

## 2011-03-16 NOTE — Progress Notes (Signed)
Encounter addended by: Lowella Petties, RN on: 03/16/2011  1:12 PM<BR>     Documentation filed: Inpatient Patient Education

## 2011-03-16 NOTE — Telephone Encounter (Signed)
Patient asking to speak with Dr. Truett Perna about his consult with radiation oncology today. Dr. Dayton Scrape is leaning toward radiation therapy when Dr. Truett Perna did not feel it may be necessary. Also Dr. Dayton Scrape suggests he have another PET scan prior to his [facility] 2nd opinion appointment on 1/29, although patient wants it done before 1/18 visit with Dr. Truett Perna. Made him aware that MD will be notified of his questions.

## 2011-03-16 NOTE — Progress Notes (Signed)
Encounter addended by: Ardell Isaacs on: 03/16/2011 12:23 PM<BR>     Documentation filed: Charges VN

## 2011-03-16 NOTE — Progress Notes (Signed)
Pt goes to [facility] January 29,2013 for 2nd opinion, NHL, no neuropathy, pt now taking nyquil caps at night, getting dayquil caps today for cold, pt has a power port mid sternum, Last chemotherapy  02/26/11 Chop, Chemotherapy completed, sees Dr. Truett Perna 2 weeks,  Single, maternal grandmother had sinus cavity cancer,   Allergiess: Palpitations intolerance?

## 2011-03-16 NOTE — Progress Notes (Signed)
[facility] Radiation Oncology NEW PATIENT EVALUATION  Name: [name] MRN: [id number]  Date: 03/16/2011  DOB: Sep 24, 1980  Status: outpatient   CC: Fredirick Maudlin, MD, MD  Lucile Shutters,*    REFERRING PHYSICIAN: Lucile Shutters,*   DIAGNOSIS: Stage I BE non-Hodgkin's lymphoma, diffuse large B-cell type    HISTORY OF PRESENT ILLNESS:  [name] is a 31 y.o. male who is  Seen today for the courtesy of Dr. Truett Perna for his stage I BE diffuse large B-cell non-Hodgkin's lymphoma. He presented in August of 2012 with progressive dyspnea, and cough. He fell he had bronchitis. He visited the [facility] emergency room and had a chest x-ray on August 6 showing a mediastinal mass a. A followup chest x-ray showed a large irregular mediastinum a mass involving the anterior and middle mediastinum on the left side. There was an associated small pericardial effusion. The mass measured 8 x 8 x 8 cm. Dr. Edwyna Shell performed a mediastinotomy with biopsy on 11/06/2010 with a diagnosis of CD positive non-Hodgkin's lymphoma, diffuse large B-cell type. His PET scan on 11/11/2010 showed again a large anterior mediastinal mass consistent with malignancy. There were 2 separate foci of increased uptake within the anterior mediastinum to the right of the midline. The mass measured 12.1 x 8.6 x 12.2 cm a. The SUV Max was 36.9. There was mass effect upon the left mainstem bronchus and areas of postobstructive pneumonitis were noted within the left lower lobe and left upper lobe. His staging workup including bone marrow biopsies were negative for disease elsewhere. He want to receive 6 cycles of CHOP/Rituxan chemotherapy under the direction of Dr. Truett Perna and this was completed on December 21. His PET scan on November 26 after completion of 4 cycles of chemotherapy showed a marked response to therapy. At the time of his presentation he did report having night sweats but he attributes to the  warm summer weather. He did not have any weight loss and his appetite remained good. Of note is that Dr. Edwyna Shell felt that he had involvement of the left phrenic nerve because of his elevated left hemidiaphragm the time of presentation.     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     PAST SURGICAL HISTORY:  Past Surgical History  Procedure Date  . Mediastinotomy 11/06/2010    Lt anterior  mediastinal mass, rule out lymphoma  . Insertion of right ij port-a-cath. 11/30/10    [place]  . Portacath placement 11/2010  . Eardrum repair   . Extraction of multiple wisdom teeth      FAMILY HISTORY: family history includes Cancer in his maternal grandmother.   SOCIAL HISTORY:  reports that he has never smoked. He has never used smokeless tobacco. He reports that he drinks about .6 ounces of alcohol per week. He reports that he does not use illicit drugs.   ALLERGIES: Levaquin   MEDICATIONS:  Current Outpatient Prescriptions  Medication Sig Dispense Refill  . acetaminophen (TYLENOL) 650 MG CR tablet Take 650 mg by mouth every 4 (four) hours as needed.        Marland Kitchen DM-Doxylamine-Acetaminophen (VICKS NYQUIL COLD & FLU) 15-6.25-325 MG CAPS Take 2 capsules by mouth at bedtime.        Marland Kitchen DM-Phenylephrine-Acetaminophen (VICKS DAYQUIL COLD & FLU) 10-5-325 MG CAPS  Take 1 capsule by mouth daily. To get today       . oxyCODONE-acetaminophen (PERCOCET) 5-325 MG per tablet Take 1-2 tablets by mouth every 4 (four) hours as needed.  30 tablet  0  . prochlorperazine (COMPAZINE) 10 MG tablet Take 10 mg by mouth every 6 (six) hours as needed.        . warfarin (COUMADIN) 5 MG tablet Take 1 tablet (5 mg total) by mouth daily.  30 tablet  0  . chlorpheniramine-HYDROcodone (TUSSIONEX) 10-8 MG/5ML LQCR  Take 5 mLs by mouth every 12 (twelve) hours as needed.        Marland Kitchen guaiFENesin (MUCINEX) 600 MG 12 hr tablet Take 1,200 mg by mouth 2 (two) times daily.        . pantoprazole (PROTONIX) 40 MG tablet Take 1 tablet (40 mg total) by mouth daily.  30 tablet  3  . predniSONE (DELTASONE) 20 MG tablet Take 5 tablets (100 mg total) by mouth daily. X 5 days after each chemo  25 tablet  1  . Pseudoeph-Doxylamine-DM-APAP (NYQUIL PO) Take by mouth.            REVIEW OF SYSTEMS:  Pertinent items are noted in HPI.    PHYSICAL EXAM:  height is 6' (1.829 m) and weight is 204 lb 1.6 oz (92.579 kg). His oral temperature is 97.6 F (36.4 C). His blood pressure is 116/79 and his pulse is 97. His respiration is 20.    Head and neck examination: Some regrowth of hair. Oral cavity and oropharynx are unremarkable to inspection. Nodes: Without palpable cervical, clavicular, axillary or inguinal lymphadenopathy. Chest: Left parasternal scar from his mediastinoscopy. He does have a right parasternal Port-A-Cath. There are diminished breath sounds along the right mid to lower lung zone. Right lung is clear. Heart: Regular in rhythm. Abdomen soft without masses or organomegaly. Back: Without spinal or CVA discomfort. Extremities without edema. Neurologic examination grossly nonfocal.   LABORATORY DATA:  Lab Results  Component Value Date   WBC 4.2 02/26/2011   HGB 14.7 02/26/2011   HCT 42.3 02/26/2011   MCV 92.2 02/26/2011   PLT 249 02/26/2011   Lab Results  Component Value Date   NA 136 02/26/2011   K 3.7 02/26/2011   CL 100 02/26/2011   CO2 27 02/26/2011   Lab Results  Component Value Date   ALT 54* 02/26/2011   AST 26 02/26/2011   ALKPHOS 76 02/26/2011   BILITOT 0.2* 02/26/2011      IMPRESSION: Stage I BE diffuse large B-cell non-Hodgkin's lymphoma. The patient has had a very favorable response to the chemotherapy. In view of his bulky disease presentation, do recommend post chemotherapy radiation  therapy. I think it would be helpful to repeat his PET scan to see if he's had a complete response. If he's had a complete response than I would recommend delivery of 3000-3600  cGy to his mediastinum. This is based on the NCCN guidelines. If there is residual activity then I would deliver approximately 4500 cGy. We discussed the potential acute and late toxicities of radiation therapy including the potential for cardiac morbidity down the road. He will obtain a second opinion at [facility] on January 29. He or Dr. Truett Perna can contact me in early February for scheduling of his radiation therapy at [facility] is in agreement.   PLAN: As discussed above. The patient will see Dr. Truett Perna for a followup visit on January 18. I think it would be  helpful for him to have a PET scan prior to his consultation at [facility]. This can be arranged by Dr. Truett Perna if he is in agreement.   I spent 60 minutes minutes face to face with the patient and more than 50% of that time was spent in counseling and/or coordination of care.

## 2011-03-16 NOTE — Progress Notes (Signed)
Encounter addended by: Veneda Melter, RN on: 03/16/2011  1:03 PM<BR>     Documentation filed: Follow-up Section, LOS Section, Visit Diagnoses, Notes Section

## 2011-03-17 NOTE — Telephone Encounter (Signed)
Schedule PET as recommend by Dr. Dayton Scrape. Schedule prior to 1/18 visit.  I will review PET and discuss indication for xrt at 1/18 visit.  Has he been scheduled with Dr. Peggye Pitt at [facility]?

## 2011-03-18 ENCOUNTER — Telehealth: Payer: Self-pay | Admitting: *Deleted

## 2011-03-18 ENCOUNTER — Telehealth: Payer: Self-pay | Admitting: Oncology

## 2011-03-18 ENCOUNTER — Other Ambulatory Visit: Payer: Self-pay | Admitting: *Deleted

## 2011-03-18 DIAGNOSIS — C8590 Non-Hodgkin lymphoma, unspecified, unspecified site: Secondary | ICD-10-CM

## 2011-03-18 NOTE — Telephone Encounter (Signed)
S/w jessica from rad to schedule the pt for his pet scan appt that had to be worked in

## 2011-03-18 NOTE — Telephone Encounter (Signed)
Notified patient that Dr. Truett Perna has ordered PET scan to be done prior to his 1/18 visit and he will review this with him and need for radiation at that visit. Will have CD made for his [facility] appointment on 04/06/11 (requested in order). He understands and agrees with this plan

## 2011-03-25 ENCOUNTER — Encounter (HOSPITAL_COMMUNITY)
Admission: RE | Admit: 2011-03-25 | Discharge: 2011-03-25 | Disposition: A | Discharge status: Home / Self Care | Payer: BC Managed Care – PPO | Source: Ambulatory Visit | Attending: Oncology | Admitting: Oncology

## 2011-03-25 ENCOUNTER — Encounter (HOSPITAL_COMMUNITY): Payer: Self-pay

## 2011-03-25 DIAGNOSIS — C8589 Other specified types of non-Hodgkin lymphoma, extranodal and solid organ sites: Secondary | ICD-10-CM | POA: Insufficient documentation

## 2011-03-25 DIAGNOSIS — C8590 Non-Hodgkin lymphoma, unspecified, unspecified site: Secondary | ICD-10-CM

## 2011-03-25 LAB — GLUCOSE, CAPILLARY: Glucose-Capillary: 88 mg/dL (ref 70–99)

## 2011-03-25 MED ORDER — FLUDEOXYGLUCOSE F - 18 (FDG) INJECTION
17.9000 | Freq: Once | INTRAVENOUS | Status: AC | PRN
  Administered 2011-03-25: 17.9 via INTRAVENOUS

## 2011-03-26 ENCOUNTER — Telehealth: Payer: Self-pay | Admitting: *Deleted

## 2011-03-26 ENCOUNTER — Telehealth: Payer: Self-pay | Admitting: Oncology

## 2011-03-26 ENCOUNTER — Ambulatory Visit (HOSPITAL_BASED_OUTPATIENT_CLINIC_OR_DEPARTMENT_OTHER): Payer: BC Managed Care – PPO | Admitting: Oncology

## 2011-03-26 ENCOUNTER — Other Ambulatory Visit: Payer: BC Managed Care – PPO | Admitting: Lab

## 2011-03-26 VITALS — BP 112/78 | HR 76 | Temp 97.1°F | Ht 72.0 in | Wt 204.5 lb

## 2011-03-26 DIAGNOSIS — I82629 Acute embolism and thrombosis of deep veins of unspecified upper extremity: Secondary | ICD-10-CM

## 2011-03-26 DIAGNOSIS — C8590 Non-Hodgkin lymphoma, unspecified, unspecified site: Secondary | ICD-10-CM

## 2011-03-26 DIAGNOSIS — C8589 Other specified types of non-Hodgkin lymphoma, extranodal and solid organ sites: Secondary | ICD-10-CM

## 2011-03-26 LAB — CBC WITH DIFFERENTIAL/PLATELET
BASO%: 1.7 % (ref 0.0–2.0)
Basophils Absolute: 0.1 10*3/uL (ref 0.0–0.1)
EOS%: 1 % (ref 0.0–7.0)
Eosinophils Absolute: 0 10*3/uL (ref 0.0–0.5)
HCT: 43.3 % (ref 38.4–49.9)
HGB: 15.2 g/dL (ref 13.0–17.1)
LYMPH%: 15.7 % (ref 14.0–49.0)
MCH: 32.3 pg (ref 27.2–33.4)
MCHC: 35.1 g/dL (ref 32.0–36.0)
MCV: 91.9 fL (ref 79.3–98.0)
MONO#: 0.7 10*3/uL (ref 0.1–0.9)
MONO%: 16.2 % — ABNORMAL HIGH (ref 0.0–14.0)
NEUT#: 2.6 10*3/uL (ref 1.5–6.5)
NEUT%: 65.4 % (ref 39.0–75.0)
Platelets: 256 10*3/uL (ref 140–400)
RBC: 4.71 10*6/uL (ref 4.20–5.82)
RDW: 13.7 % (ref 11.0–14.6)
WBC: 4 10*3/uL (ref 4.0–10.3)
lymph#: 0.6 10*3/uL — ABNORMAL LOW (ref 0.9–3.3)
nRBC: 0 % (ref 0–0)

## 2011-03-26 LAB — PROTIME-INR
INR: 1.6 — ABNORMAL LOW (ref 2.00–3.50)
Protime: 19.2 Seconds — ABNORMAL HIGH (ref 10.6–13.4)

## 2011-03-26 NOTE — Progress Notes (Signed)
OFFICE PROGRESS NOTE   INTERVAL HISTORY:   He completed a final cycle of chemotherapy on 02/26/2011. He reports tolerating the chemotherapy well. He denies nausea, shortness of breath, and neuropathy symptoms. The cough has resolved.  Dr. Dayton Scrape recommends radiation to the mediastinum. He is scheduled to see Dr. Peggye Pitt on 04/06/2011.  Objective:  Vital signs in last 24 hours:  Blood pressure 112/78, pulse 76, temperature 97.1 F (36.2 C), temperature source Oral, height 6' (1.829 m), weight 204 lb 8 oz (92.761 kg).    HEENT: Mild whitecoat over the tongue. No buccal thrush. No ulcer Lymphatics: No cervical or supraclavicular nodes Resp: Decreased breath sounds at the left lower chest. No respiratory distress. Cardio: Regular rate and rhythm GI: No hepatosplenomegaly Vascular: No leg edema   Portacath/PICC-without erythema  Lab Results:  Lab Results  Component Value Date   WBC 4.0 03/26/2011   HGB 15.2 03/26/2011   HCT 43.3 03/26/2011   MCV 91.9 03/26/2011   PLT 256 03/26/2011   ANC 2.6 PT/INR 1.6  X-rays: Restaging PET scan 03/25/2011 was compared to 02/02/2011. No abnormal hypermetabolism in the neck, chest, abdomen, or pelvis. The prevascular soft tissue now measures 1.9 x 3.6 cm compared to a previous measurement of 2.6 x 3.8 cm and does not show abnormal FDG uptake. I reviewed the PET scan with the patient and his family.  Medications: I have reviewed the patient's current medications.  Assessment/Plan: 1. Diffuse large B-cell lymphoma involving a large anterior mediastinal mass, CD20 positive. He is status post cycle 1 CHOP/Rituxan chemotherapy 11/13/2010 (Rituxan was given on 11/14/2010). He completed cycle 2 CHOP/Rituxan 12/04/2010. He completed cycle #3 on October 19. He completed cycle #4 on 01/14/2011 -a restaging PET scan on November 27 shows a marketed improvement in the anterior mediastinal mass/hypermetabolic activity with a small remaining mediastinal mass with  mildly increased FDG activity. He completed cycle 6 of CHOP/Rituxan on 02/26/2011. 2. Cough secondary to pneumonitis and obstruction from the mediastinal/left upper chest mass, resolved..  3. Left upper extremity deep vein thrombosis diagnosed while hospitalized September 2012. He will continue Coumadin 5 mg daily.  4.  Postobstructive pneumonitis, status post Avelox therapy.  5. Fever/sweats secondary to number 1, resolved.  6. Status post placement of a right PICC line. The PICC was removed on 11/25/2010.  7. Status post Port-A-Cath placement 11/30/2010.  8. History of tachycardia. An echocardiogram was unremarkable. The tachycardia has improved       9.   Upper respiratory infection when he was here on 02/05/2011-partially improved. He had a persistent cough 02/26/2011. A chest x-ray was negative on 02/26/2011.      Disposition:  He has completed systemic therapy for treatment of the large B-cell lymphoma. He appears to be in clinical remission. The restaging PET scan on 03/25/2011 reveals a continued decrease in size of the prevascular soft tissue mass and no area of hypermetabolism.  He saw Dr. Dayton Scrape to consider radiation consolidation. Mr. Whisenant is reluctant to proceed with radiation due to the potential for cardiac toxicity. He plans to see Dr. Peggye Pitt for a second opinion on 04/06/2011. He will return for an office visit on 04/09/2011. We will refer him back to Dr. Dayton Scrape based on the recommendations from [facility].   Lucile Shutters, MD  03/26/2011  5:36 PM

## 2011-03-26 NOTE — Telephone Encounter (Signed)
Per Dr. Truett Perna: Continue 5mg  daily. Recheck on 2/1

## 2011-03-26 NOTE — Telephone Encounter (Signed)
gv pt appt schedule for feb °

## 2011-03-31 ENCOUNTER — Other Ambulatory Visit: Payer: Self-pay | Admitting: *Deleted

## 2011-03-31 DIAGNOSIS — I82629 Acute embolism and thrombosis of deep veins of unspecified upper extremity: Secondary | ICD-10-CM

## 2011-03-31 MED ORDER — WARFARIN SODIUM 5 MG PO TABS: 5.0000 mg | ORAL_TABLET | Freq: Every day | ORAL | Status: DC

## 2011-03-31 NOTE — Telephone Encounter (Signed)
Call from pt requesting refill on Coumadin. Rx sent electronically.

## 2011-04-07 ENCOUNTER — Telehealth: Payer: Self-pay | Admitting: *Deleted

## 2011-04-07 NOTE — Telephone Encounter (Signed)
Was at [facility] 04/06/11 and port was flushed there. Wanted to cancel the flush appointment for tomorrow. Still plans on seeing MD.

## 2011-04-09 ENCOUNTER — Ambulatory Visit (HOSPITAL_BASED_OUTPATIENT_CLINIC_OR_DEPARTMENT_OTHER): Payer: BC Managed Care – PPO | Admitting: Oncology

## 2011-04-09 ENCOUNTER — Telehealth: Payer: Self-pay | Admitting: Oncology

## 2011-04-09 ENCOUNTER — Other Ambulatory Visit: Payer: Self-pay | Admitting: *Deleted

## 2011-04-09 ENCOUNTER — Other Ambulatory Visit (HOSPITAL_BASED_OUTPATIENT_CLINIC_OR_DEPARTMENT_OTHER): Payer: BC Managed Care – PPO | Admitting: Lab

## 2011-04-09 VITALS — BP 142/88 | HR 103 | Temp 97.5°F | Ht 72.0 in | Wt 203.1 lb

## 2011-04-09 DIAGNOSIS — C8590 Non-Hodgkin lymphoma, unspecified, unspecified site: Secondary | ICD-10-CM

## 2011-04-09 DIAGNOSIS — I82629 Acute embolism and thrombosis of deep veins of unspecified upper extremity: Secondary | ICD-10-CM

## 2011-04-09 DIAGNOSIS — R222 Localized swelling, mass and lump, trunk: Secondary | ICD-10-CM

## 2011-04-09 DIAGNOSIS — Z79899 Other long term (current) drug therapy: Secondary | ICD-10-CM

## 2011-04-09 DIAGNOSIS — C8589 Other specified types of non-Hodgkin lymphoma, extranodal and solid organ sites: Secondary | ICD-10-CM

## 2011-04-09 LAB — PROTIME-INR
INR: 1.8 — ABNORMAL LOW (ref 2.00–3.50)
Protime: 21.6 Seconds — ABNORMAL HIGH (ref 10.6–13.4)

## 2011-04-09 NOTE — Progress Notes (Signed)
OFFICE PROGRESS NOTE   INTERVAL HISTORY:   He returns as scheduled. He has no new complaint. He denies cough and shortness of breath.  He saw Dr. Peggye Pitt at [facility] yesterday. She agrees with the plan for radiation consolidation. She recommends CNS prophylaxis with methotrexate. He is concerned of the potential toxicities associated with methotrexate. Dr. Peggye Pitt discussed a "maintenance "clinical trial with rituximab and Revlimid. He does not wish to when role in the clinical trial.  Objective:  Vital signs in last 24 hours:  Blood pressure 142/88, pulse 103, temperature 97.5 F (36.4 C), temperature source Oral, height 6' (1.829 m), weight 203 lb 1.6 oz (92.126 kg).    HEENT: Neck without mass Lymphatics: No cervical or supraclavicular nodes Resp: Decreased breath sounds at the left lower chest. No respiratory distress. Cardio: Regular rate and rhythm GI: No hepatosplenomegaly Vascular: No leg edema    Portacath/PICC-without erythema  Lab Results:  Lab Results  Component Value Date   WBC 4.0 03/26/2011   HGB 15.2 03/26/2011   HCT 43.3 03/26/2011   MCV 91.9 03/26/2011   PLT 256 03/26/2011    PT/INR 1.8  Medications: I have reviewed the patient's current medications.  Assessment/Plan: 1. Diffuse large B-cell lymphoma involving a large anterior mediastinal mass, CD20 positive. He is status post cycle 1 CHOP/Rituxan chemotherapy 11/13/2010 (Rituxan was given on 11/14/2010). He completed cycle 2 CHOP/Rituxan 12/04/2010. He completed cycle #3 on October 19. He completed cycle #4 on 01/14/2011 -a restaging PET scan on November 27 shows a marketed improvement in the anterior mediastinal mass/hypermetabolic activity with a small remaining mediastinal mass with mildly increased FDG activity. He completed cycle 6 of CHOP/Rituxan on 02/26/2011.            -restaging PET scan Gemzar 17 2013 revealed no abnormal hypermetabolism in the neck, chest, abdomen, or pelvis. The prevascular soft  tissue mass was smaller, measuring 1.9 x 3.6 cm 2. Cough secondary to pneumonitis and obstruction from the mediastinal/left upper chest mass, resolved..  3. Left upper extremity deep vein thrombosis diagnosed while hospitalized September 2012. He will continue Coumadin 5 mg daily.  4. Postobstructive pneumonitis, status post Avelox therapy.  5. Fever/sweats secondary to number 1, resolved.  6. Status post placement of a right PICC line. The PICC was removed on 11/25/2010.  7. Status post Port-A-Cath placement 11/30/2010.  8. History of tachycardia. An echocardiogram was unremarkable. The tachycardia has improved       9.   Upper respiratory infection when he was here on 02/05/2011-partially improved. He had a persistent cough 02/26/2011. A chest x-ray was negative on 02/26/2011.       Disposition:  He appears stable. I discussed the case with Dr. Peggye Pitt yesterday. I discussed the indication for CNS prophylaxis with the patient and his parents. I discussed the treatment schema and potential toxicities associated with high-dose methotrexate therapy. He has a good prognosis for a long-term disease-free survival. He is hesitant to commit to a course of CNS prophylaxis given the low likelihood of a CNS relapse in his case. He agrees to radiation consolidation.  Mr. Witt requests a third opinion regarding CNS prophylaxis. We will contact Dr. Verdon Cummins at [facility]. The plan is to proceed with a radiation oncology referral. He will return for an office visit in 3 weeks. We will arrange for hospital admission prior to or following radiation if he decides to proceed with methotrexate prophylaxis.   Lucile Shutters, MD  04/09/2011  5:02 PM

## 2011-04-09 NOTE — Telephone Encounter (Signed)
appt made for 2/22 and 2/5 for rad onc

## 2011-04-12 ENCOUNTER — Telehealth: Payer: Self-pay | Admitting: Oncology

## 2011-04-12 NOTE — Telephone Encounter (Signed)
called pt and informed him to comE IN AT 8:30am on 04/30/2011

## 2011-04-12 NOTE — Progress Notes (Signed)
Patient returns for a follow up new consult with Dr. Dayton Scrape after visiting [facility] for a second medical oncology opinion. Per Dr. Juanetta Gosling note it is recommended that the patient receive treatment for CNS prophylaxis with IV high dose Methotrexate. Dr. Juanetta Gosling agrees with Dr. Dayton Scrape that mediastinal mass radiation is in his best interest to reduce risk of future recurrence. The patient expressed to Dr. Juanetta Gosling he plans to proceed with radiation therapy locally. Dr. Juanetta Gosling recommends that proceeding with a high dose of MTX prior to starting radiation therapy. Then he would have him return for second and third dose after completing radiation therapy. Patient is undecided on whether to proceed with MTX. Dr. Juanetta Gosling also discussed with the patient the opportunity to enroll in the Revlimid/Rituximab clinical trial at [facility].

## 2011-04-13 ENCOUNTER — Encounter: Payer: Self-pay | Admitting: Radiation Oncology

## 2011-04-13 ENCOUNTER — Ambulatory Visit
Admission: RE | Admit: 2011-04-13 | Discharge: 2011-04-13 | Disposition: A | Discharge status: Home / Self Care | Payer: BC Managed Care – PPO | Source: Ambulatory Visit | Attending: Radiation Oncology | Admitting: Radiation Oncology

## 2011-04-13 VITALS — BP 121/80 | HR 97 | Temp 98.4°F | Resp 20 | Ht 72.0 in | Wt 203.1 lb

## 2011-04-13 DIAGNOSIS — C8590 Non-Hodgkin lymphoma, unspecified, unspecified site: Secondary | ICD-10-CM

## 2011-04-13 NOTE — Progress Notes (Signed)
Please see the Nurse Progress Note in the MD Initial Consult Encounter for this patient. 

## 2011-04-13 NOTE — Progress Notes (Signed)
  Followup note:  Diagnosis stage IBE non-Hodgkin's lymphoma, diffuse large B-cell type  Mr. Montilla returns today for review and scheduling of his consolidative radiation therapy in the management of his stage Ib he non-Hodgkin's lymphoma. I saw the patient in consultation almost one month ago following chemotherapy. He an excellent response to chemotherapy and his staging PET scan from 03/25/2011 showed no evidence of recurrent lymphoma with a continued decrease in the size of the prevascular soft tissue along the anterior mediastinum. He denies B. symptoms. He was seen at [facility] for a second opinion and they offered CNS prophylaxis with IV high-dose methotrexate. He is seeking another opinion at [facility] in the near future. Dr. Truett Perna feels that it is time to proceed with consolidative radiation therapy.  Physical examination: Head and neck examination: Oral cavity and oropharynx are unremarkable to inspection. Nodes: There is no palpable lymphadenopathy in the neck or axilla.. Chest: Lungs clear. Back: Without spinal or CVA tenderness. Heart: Regular rate and rhythm. Abdomen: Without masses or organomegaly. Extremities: Without edema. Neurologic examination: Grossly nonfocal.  Laboratory data from 03/26/2011 remarkable for white blood count 4.0 K. hemoglobin 15.2 and platelet count 250 6K.  Impression: Stage IBE non-Hodgkin's lymphoma, diffuse large B-cell type. Based on the NCCN guidelines, he should receive 3000- 3600 cGy to his previous area of bulky disease along his mediastinum. We discussed the potential acute and late toxicities of radiation therapy and he wishes to proceed as outlined. As mentioned above, he will seek a second opinion at [facility] Re: CNS prophylaxis.  Plan: He'll return here in the near future for simulation/treatment planning. Consent is signed today.  30 minutes was spent face-to-face with the patient and his family, primarily counseling the patient and coordinating his  care.

## 2011-04-13 NOTE — Progress Notes (Signed)
Pt has no c/o today. Here to review pet scan results. Pt has gotten opinion at [facility], will go to [facility] for another opinion on chemotherapy.

## 2011-04-13 NOTE — Progress Notes (Signed)
Encounter addended by: Ardell Isaacs, RN on: 04/13/2011  4:28 PM<BR>     Documentation filed: Charges VN

## 2011-04-19 ENCOUNTER — Ambulatory Visit
Admission: RE | Admit: 2011-04-19 | Discharge: 2011-04-19 | Disposition: A | Discharge status: Home / Self Care | Payer: BC Managed Care – PPO | Source: Ambulatory Visit | Attending: Radiation Oncology | Admitting: Radiation Oncology

## 2011-04-19 DIAGNOSIS — C8590 Non-Hodgkin lymphoma, unspecified, unspecified site: Secondary | ICD-10-CM

## 2011-04-19 NOTE — Progress Notes (Signed)
Met with patient to discuss RO billing.  Rad Tx:  [id number] Extrl Beam   Attending Rad: Dr. Dayton Scrape   Dx: [phone] *Other lymphomas, unspecified site, Non Hodgkins Lymp

## 2011-04-20 ENCOUNTER — Telehealth: Payer: Self-pay | Admitting: Oncology

## 2011-04-20 NOTE — Progress Notes (Signed)
Simulation/treatment planning note [name] underwent simulation/treatment planning 04/19/2011. A head cast was constructed for immobilization. He was then scanned. His CT data set was fused with his pre-chemotherapy PET scan and also late chemotherapy PET scan for planning purposes. 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  I contoured his high risk lymph nodes and also his area of involvement a part of his chemotherapy. He is setup to AP and PA fields. 2 separate multileaf collimators were designed to conform the field is normal shredding structures including his heart, lungs, and spinal cord were contoured. I'm prescribing 1600 cGy in 10 sessions utilizing 10 MV photons. This was followed by reduced field admitting office left ventricle for a further 1700 cGy in 10 sessions, again utilizing 10 MV photons. Dose volume histograms are requested (3-D conformal simulation/planning). I requesting a diode at his CR during his first treatment.

## 2011-04-20 NOTE — Telephone Encounter (Signed)
Pt. Appt with Dr. Elmon Kirschner @ [facility] is 05/04/11 @ 12:30. Medical records faxed. Scans and slides will be fedex'ed. Pt is aware

## 2011-04-23 ENCOUNTER — Telehealth: Payer: Self-pay | Admitting: *Deleted

## 2011-04-23 NOTE — Telephone Encounter (Signed)
Call from pt confirming appts. Pt will need to pick up scans on disc, requested radiology have these ready for pt pick up on Mon.

## 2011-04-26 ENCOUNTER — Ambulatory Visit
Admission: RE | Admit: 2011-04-26 | Discharge: 2011-04-26 | Disposition: A | Discharge status: Home / Self Care | Payer: BC Managed Care – PPO | Source: Ambulatory Visit | Attending: Radiation Oncology | Admitting: Radiation Oncology

## 2011-04-26 ENCOUNTER — Encounter: Payer: Self-pay | Admitting: Radiation Oncology

## 2011-04-26 NOTE — Progress Notes (Signed)
Simulation verification note: The patient underwent simulation verification for treatment to his chest. His isocenter is in good position and the multileaf collimators contoured the treatment volume appropriately. 

## 2011-04-27 ENCOUNTER — Ambulatory Visit
Admission: RE | Admit: 2011-04-27 | Discharge: 2011-04-27 | Disposition: A | Discharge status: Home / Self Care | Payer: BC Managed Care – PPO | Source: Ambulatory Visit | Attending: Radiation Oncology | Admitting: Radiation Oncology

## 2011-04-27 ENCOUNTER — Encounter: Payer: Self-pay | Admitting: Radiation Oncology

## 2011-04-27 VITALS — BP 137/81 | HR 106 | Resp 18 | Wt 203.4 lb

## 2011-04-27 DIAGNOSIS — C8590 Non-Hodgkin lymphoma, unspecified, unspecified site: Secondary | ICD-10-CM

## 2011-04-27 NOTE — Progress Notes (Signed)
Weekly Management Note:  Site:Mediastinum Current Dose:  160  cGy Projected Dose: 3300  cGy  Narrative: The patient is seen today for routine under treatment assessment. CBCT/MVCT images/port films were reviewed. The chart was reviewed.   No complaints today except for a persistent dry cough for which he is taking Mucinex.  Physical Examination:  Filed Vitals:   04/27/11 0954  BP: 137/81  Pulse: 106  Resp: 18  .  Weight: 203 lb 6.4 oz (92.262 kg). 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  Impression: Tolerating radiation therapy well.  Plan: Continue radiation therapy as planned.

## 2011-04-27 NOTE — Progress Notes (Signed)
Patient presents to the clinic today unaccompanied for under treat visit with Dr. Dayton Scrape. Patient is alert and oriented to person, place, and time. No distress noted. Steady gait noted. Pleasant affect noted. Patient denies pain at this time. Patient reports an occasional dry cough. Patient reports taking Mucinex. Patient reports that he is sleeping better now that his cough is under control. Reported all findings to Dr. Dayton Scrape.

## 2011-04-28 ENCOUNTER — Ambulatory Visit
Admission: RE | Admit: 2011-04-28 | Discharge: 2011-04-28 | Disposition: A | Discharge status: Home / Self Care | Payer: BC Managed Care – PPO | Source: Ambulatory Visit | Attending: Radiation Oncology | Admitting: Radiation Oncology

## 2011-04-28 DIAGNOSIS — C8590 Non-Hodgkin lymphoma, unspecified, unspecified site: Secondary | ICD-10-CM

## 2011-04-28 NOTE — Progress Notes (Signed)
Received patient in the clinic today following treatment for post sim education. Patient unaccompanied. Patient is alert and oriented to person, place, and time. No distress noted. Steady gait noted. Pleasant affect noted. Patient denies pain at this time. Patient reports that he did not sleep well last night because he was "restless." Oriented patient to staff and routine of the clinic. Provided patient with Radiation Therapy and You handbook then, reviewed pertinent information. Educated patient reference potential side effects related to radiation therapy. Answered all questions asked. Provided patient with this writers business card and encouraged him to call with needs. Patient verbalized understanding of all things reviewed.

## 2011-04-29 ENCOUNTER — Ambulatory Visit
Admission: RE | Admit: 2011-04-29 | Discharge: 2011-04-29 | Disposition: A | Discharge status: Home / Self Care | Payer: BC Managed Care – PPO | Source: Ambulatory Visit | Attending: Radiation Oncology | Admitting: Radiation Oncology

## 2011-04-29 ENCOUNTER — Encounter: Payer: Self-pay | Admitting: Radiation Oncology

## 2011-04-29 NOTE — Progress Notes (Signed)
Chart note: The patient had a diode readings on 04/28/2011. The reading that the predicted dose at his isocenter.

## 2011-04-30 ENCOUNTER — Ambulatory Visit
Admission: RE | Admit: 2011-04-30 | Discharge: 2011-04-30 | Disposition: A | Discharge status: Home / Self Care | Payer: BC Managed Care – PPO | Source: Ambulatory Visit | Attending: Radiation Oncology | Admitting: Radiation Oncology

## 2011-04-30 ENCOUNTER — Ambulatory Visit (HOSPITAL_BASED_OUTPATIENT_CLINIC_OR_DEPARTMENT_OTHER): Payer: BC Managed Care – PPO | Admitting: Oncology

## 2011-04-30 ENCOUNTER — Other Ambulatory Visit: Payer: BC Managed Care – PPO | Admitting: Lab

## 2011-04-30 ENCOUNTER — Telehealth: Payer: Self-pay | Admitting: Oncology

## 2011-04-30 DIAGNOSIS — I82629 Acute embolism and thrombosis of deep veins of unspecified upper extremity: Secondary | ICD-10-CM

## 2011-04-30 DIAGNOSIS — C8589 Other specified types of non-Hodgkin lymphoma, extranodal and solid organ sites: Secondary | ICD-10-CM

## 2011-04-30 LAB — PROTIME-INR
INR: 1.9 — ABNORMAL LOW (ref 2.00–3.50)
Protime: 22.8 s — ABNORMAL HIGH (ref 10.6–13.4)

## 2011-04-30 NOTE — Progress Notes (Signed)
OFFICE PROGRESS NOTE   INTERVAL HISTORY:   He returns as scheduled. He started chest radiation last week. He has no new complaint.  Objective:  Vital signs in last 24 hours:  Blood pressure 126/76, pulse 103, temperature 97.2 F (36.2 C), temperature source Oral, height 6' (1.829 m), weight 203 lb 6.4 oz (92.262 kg).    HEENT: No thrush or ulcers, geographic tongue Resp: Decreased breath sounds at the left lower chest. No respiratory distress Cardio: Regular rate and rhythm GI: No hepatosplenomegaly Vascular: No leg edema.    Portacath/PICC-without erythema  Lab Results:  Lab Results  Component Value Date   WBC 4.0 03/26/2011   HGB 15.2 03/26/2011   HCT 43.3 03/26/2011   MCV 91.9 03/26/2011   PLT 256 03/26/2011    PT INR-1.9  Medications: I have reviewed the patient's current medications.  Assessment/Plan: 1. Diffuse large B-cell lymphoma involving a large anterior mediastinal mass, CD20 positive. He is status post cycle 1 CHOP/Rituxan chemotherapy 11/13/2010 (Rituxan was given on 11/14/2010). He completed cycle 2 CHOP/Rituxan 12/04/2010. He completed cycle #3 on October 19. He completed cycle #4 on 01/14/2011 -a restaging PET scan on November 27 shows a marketed improvement in the anterior mediastinal mass/hypermetabolic activity with a small remaining mediastinal mass with mildly increased FDG activity. He completed cycle 6 of CHOP/Rituxan on 02/26/2011. -restaging PET scan 03/25/2011 revealed no abnormal hypermetabolism in the neck, chest, abdomen, or pelvis. The prevascular soft tissue mass was smaller, measuring 1.9 x 3.6 cm     -he began radiation consolidation to the chest on 04/27/2011. 2. Cough secondary to pneumonitis and obstruction from the mediastinal/left upper chest mass, resolved..  3. Left upper extremity deep vein thrombosis diagnosed while hospitalized September 2012. He will continue Coumadin 5 mg daily.  4. Postobstructive pneumonitis, status post Avelox  therapy.  5. Fever/sweats secondary to number 1, resolved.  6. Status post placement of a right PICC line. The PICC was removed on 11/25/2010.  7. Status post Port-A-Cath placement 11/30/2010.  8. History of tachycardia. An echocardiogram was unremarkable. The tachycardia has improved 9. Upper respiratory infection when he was here on 02/05/2011. He had a persistent cough 02/26/2011. A chest x-ray was negative on 02/26/2011.   Disposition:  He appears stable. He is completing radiation consolidation. He remains undecided on the CNS prophylaxis. An appointment is scheduled with Dr.Beaven on 05/04/2011. We will make a final decision regarding the prophylactic methotrexate after this visit.  He is scheduled to complete radiation on 05/24/2011. He will return for an office visit here the same day.  The Port-A-Cath was flushed today.   Lucile Shutters, MD  04/30/2011  2:05 PM

## 2011-04-30 NOTE — Telephone Encounter (Signed)
appt made and printed for 3/18

## 2011-05-03 ENCOUNTER — Encounter: Payer: Self-pay | Admitting: Radiation Oncology

## 2011-05-03 ENCOUNTER — Ambulatory Visit
Admission: RE | Admit: 2011-05-03 | Discharge: 2011-05-03 | Disposition: A | Discharge status: Home / Self Care | Payer: BC Managed Care – PPO | Source: Ambulatory Visit | Attending: Radiation Oncology | Admitting: Radiation Oncology

## 2011-05-03 VITALS — BP 138/83 | HR 102 | Resp 18 | Wt 203.1 lb

## 2011-05-03 DIAGNOSIS — C8590 Non-Hodgkin lymphoma, unspecified, unspecified site: Secondary | ICD-10-CM

## 2011-05-03 NOTE — Progress Notes (Signed)
Weekly Management Note:  Site:Chest/medistinum Current Dose:  800  cGy Projected Dose: 3300  cGy  Narrative: The patient is seen today for routine under treatment assessment. CBCT/MVCT images/port films were reviewed. The chart was reviewed.   No complaints today.  Physical Examination:  Filed Vitals:   05/03/11 1005  BP: 138/83  Pulse: 102  Resp: 18  .  Weight: 203 lb 1.6 oz (92.126 kg). No change.  Impression: Tolerating radiation therapy well.  Plan: Continue radiation therapy as planned.

## 2011-05-03 NOTE — Progress Notes (Signed)
Patient presents to the clinic today unaccompanied for under treat visit with Dr. Dayton Scrape. Patient is alert and oriented to person, place, and time. No distress noted. Steady gait noted. Pleasant affect noted. Patient denies pain at this time. Patient denies nausea, vomiting, diarrhea, or headache. Patient denies cough or shortness or breath. Reported all finding to Dr. Dayton Scrape.

## 2011-05-03 NOTE — Progress Notes (Signed)
Simulation note:  [name] underwent virtual simulation for his reduced chest field. He was again setup AP and PA. 2 separate multileaf collimators were designed to conform the field. Dose volume histograms were again obtained. An isodose plan is requested. I prescribing a further 1700 cGy in 10 sessions utilizing 10 MV photons.

## 2011-05-04 ENCOUNTER — Ambulatory Visit
Admission: RE | Admit: 2011-05-04 | Discharge: 2011-05-04 | Disposition: A | Discharge status: Home / Self Care | Payer: BC Managed Care – PPO | Source: Ambulatory Visit | Attending: Radiation Oncology | Admitting: Radiation Oncology

## 2011-05-04 ENCOUNTER — Encounter: Payer: Self-pay | Admitting: Radiation Oncology

## 2011-05-04 VITALS — BP 111/80 | Resp 18 | Wt 204.0 lb

## 2011-05-04 DIAGNOSIS — C8590 Non-Hodgkin lymphoma, unspecified, unspecified site: Secondary | ICD-10-CM

## 2011-05-04 NOTE — Progress Notes (Signed)
Patient presents to the clinic today requesting to be seen by Dr. Dayton Scrape. Patient is alert and oriented to person, place, and time. No distress noted. Steady gait noted. Pleasant affect noted. Patient denies pain at this time. Patient reports having a rough night last night due to heartburn and shortness of breath. SOB has resolved. Oxy Sat. 95% on RA. Patient has Protonix prescribed but didn't think to take it. Patient is 6/10 treatment. Reported all findings to Dr. Dayton Scrape.

## 2011-05-04 NOTE — Progress Notes (Signed)
Weekly Management Note:  Site:Chest/mediastinum Current Dose:  960  cGy Projected Dose: 3300  cGy  Narrative: The patient is seen today for routine under treatment assessment. CBCT/MVCT images/port films were reviewed. The chart was reviewed.   The patient is seen today at reporting "heartburn" last night. He denies odynophagia. No respiratory difficulties.  Physical Examination:  Filed Vitals:   05/04/11 0956  BP: 111/80  Resp: 18  .  Weight: 204 lb (92.534 kg). Lungs clear.  Impression: Tolerating radiation therapy well. However, he probably has some degree of acid reflux.  Plan: Continue radiation therapy as planned. I'll have him start an H2 blocker.

## 2011-05-05 ENCOUNTER — Ambulatory Visit
Admission: RE | Admit: 2011-05-05 | Discharge: 2011-05-05 | Disposition: A | Discharge status: Home / Self Care | Payer: BC Managed Care – PPO | Source: Ambulatory Visit | Attending: Radiation Oncology | Admitting: Radiation Oncology

## 2011-05-06 ENCOUNTER — Ambulatory Visit
Admission: RE | Admit: 2011-05-06 | Discharge: 2011-05-06 | Disposition: A | Discharge status: Home / Self Care | Payer: BC Managed Care – PPO | Source: Ambulatory Visit | Attending: Radiation Oncology | Admitting: Radiation Oncology

## 2011-05-07 ENCOUNTER — Ambulatory Visit
Admission: RE | Admit: 2011-05-07 | Discharge: 2011-05-07 | Disposition: A | Discharge status: Home / Self Care | Payer: BC Managed Care – PPO | Source: Ambulatory Visit | Attending: Radiation Oncology | Admitting: Radiation Oncology

## 2011-05-10 ENCOUNTER — Ambulatory Visit
Admission: RE | Admit: 2011-05-10 | Discharge: 2011-05-10 | Disposition: A | Discharge status: Home / Self Care | Payer: BC Managed Care – PPO | Source: Ambulatory Visit | Attending: Radiation Oncology | Admitting: Radiation Oncology

## 2011-05-10 ENCOUNTER — Encounter: Payer: Self-pay | Admitting: Radiation Oncology

## 2011-05-10 VITALS — BP 126/85 | HR 116 | Resp 18 | Wt 201.8 lb

## 2011-05-10 DIAGNOSIS — C8590 Non-Hodgkin lymphoma, unspecified, unspecified site: Secondary | ICD-10-CM

## 2011-05-10 NOTE — Progress Notes (Signed)
Patient presents to the clinic today unaccompanied for under treat visit with Dr. Dayton Scrape. Patient is alert and oriented to person, place, and time. No distress noted. Steady gait noted. Pleasant affect noted. Patient reports a headache but related it to sinus pressure. Patient reports that reflux has improved since he began taking the Zantac. Patient denies nausea or vomiting. Patient reports energy level is down. Patient reports that frequently he wakes up in the middle of the night and is unable to get back to sleep for a couple hours. Reported all findings to Dr. Dayton Scrape.

## 2011-05-10 NOTE — Progress Notes (Signed)
Simulation verification note:  The patient underwent similar to verification for treatment to his mediastinum. This is his reduced field. His isocenter is in good position and the multileaf collimators contoured the treatment volume appropriately.

## 2011-05-10 NOTE — Progress Notes (Signed)
Weekly Management Note:  Site:Chest/Mediastinum Current Dose:  1600  cGy Projected Dose: 3300  cGy  Narrative: The patient is seen today for routine under treatment assessment. CBCT/MVCT images/port films were reviewed. The chart was reviewed.   His acid reflux is improved on Zantac. He does report mild fatigue. He'll begin his reduced fields tomorrow.  Physical Examination:  Filed Vitals:   05/10/11 1009  BP: 126/85  Pulse: 116  Resp: 18  .  Weight: 201 lb 12.8 oz (91.536 kg). No significant skin changes. Lungs clear.  Impression: Tolerating radiation therapy well.  Plan: Continue radiation therapy as planned.

## 2011-05-11 ENCOUNTER — Ambulatory Visit
Admission: RE | Admit: 2011-05-11 | Discharge: 2011-05-11 | Disposition: A | Discharge status: Home / Self Care | Payer: BC Managed Care – PPO | Source: Ambulatory Visit | Attending: Radiation Oncology | Admitting: Radiation Oncology

## 2011-05-12 ENCOUNTER — Ambulatory Visit
Admission: RE | Admit: 2011-05-12 | Discharge: 2011-05-12 | Disposition: A | Discharge status: Home / Self Care | Payer: BC Managed Care – PPO | Source: Ambulatory Visit | Attending: Radiation Oncology | Admitting: Radiation Oncology

## 2011-05-13 ENCOUNTER — Ambulatory Visit
Admission: RE | Admit: 2011-05-13 | Discharge: 2011-05-13 | Disposition: A | Discharge status: Home / Self Care | Payer: BC Managed Care – PPO | Source: Ambulatory Visit | Attending: Radiation Oncology | Admitting: Radiation Oncology

## 2011-05-14 ENCOUNTER — Ambulatory Visit
Admission: RE | Admit: 2011-05-14 | Discharge: 2011-05-14 | Disposition: A | Discharge status: Home / Self Care | Payer: BC Managed Care – PPO | Source: Ambulatory Visit | Attending: Radiation Oncology | Admitting: Radiation Oncology

## 2011-05-14 DIAGNOSIS — C8590 Non-Hodgkin lymphoma, unspecified, unspecified site: Secondary | ICD-10-CM

## 2011-05-14 MED ORDER — SUCRALFATE 1 G PO TABS: 1.0000 g | ORAL_TABLET | Freq: Four times a day (QID) | ORAL | Status: DC

## 2011-05-14 NOTE — Progress Notes (Signed)
[facility] Radiation Oncology Weekly Treatment Note    Name: ADAIAH MORKEN Date: 05/14/2011 MRN: [id number] DOB: 1980-04-03  Status: outpatient    Current dose: 22.8 gray  Current fraction: 14  Planned dose: 33 gray  Planned fraction: 20   MEDICATIONS: Current Outpatient Prescriptions  Medication Sig Dispense Refill  . acetaminophen (TYLENOL) 650 MG CR tablet Take 650 mg by mouth every 4 (four) hours as needed.        Marland Kitchen dextromethorphan-guaiFENesin (MUCINEX DM) 30-600 MG per 12 hr tablet Take 1 tablet by mouth every 12 (twelve) hours.      Marland Kitchen oxyCODONE-acetaminophen (PERCOCET) 5-325 MG per tablet Take 1-2 tablets by mouth every 4 (four) hours as needed.  30 tablet  0  . pantoprazole (PROTONIX) 40 MG tablet Take 1 tablet (40 mg total) by mouth daily.  30 tablet  3  . prochlorperazine (COMPAZINE) 10 MG tablet Take 10 mg by mouth every 6 (six) hours as needed.        . ranitidine (ZANTAC) 75 MG tablet Take 75 mg by mouth 2 (two) times daily.      . sucralfate (CARAFATE) 1 G tablet Take 1 tablet (1 g total) by mouth 4 (four) times daily. Dissolve in 15 cc water.  90 tablet  1  . warfarin (COUMADIN) 5 MG tablet Take 1 tablet (5 mg total) by mouth daily.  30 tablet  2     ALLERGIES: Levaquin   LABORATORY DATA:  Lab Results  Component Value Date   WBC 4.0 03/26/2011   HGB 15.2 03/26/2011   HCT 43.3 03/26/2011   MCV 91.9 03/26/2011   PLT 256 03/26/2011   Lab Results  Component Value Date   NA 136 02/26/2011   K 3.7 02/26/2011   CL 100 02/26/2011   CO2 27 02/26/2011   Lab Results  Component Value Date   ALT 54* 02/26/2011   AST 26 02/26/2011   ALKPHOS 76 02/26/2011   BILITOT 0.2* 02/26/2011      NARRATIVE: [name] was seen today for weekly treatment management. The chart was checked and port films images were reviewed. The patient complains of esophagitis. He has been taking Zantac with some minimal relief. This has worsened over the last couple of  days. He had a honey-bun today which was very irritating.  PHYSICAL EXAMINATION: vitals were not taken for this visit.  the patient is alert, in no acute distress.   ASSESSMENT: Patient tolerating treatments well. He is experiencing some esophagitis.   PLAN: Continue treatment as planned. I have given the patient a prescription for Carafate.

## 2011-05-14 NOTE — Progress Notes (Signed)
Patient c/o today that it feels like sand paper when he swallows.  Wants to know if he can get med to help this feel better.

## 2011-05-17 ENCOUNTER — Ambulatory Visit
Admission: RE | Admit: 2011-05-17 | Discharge: 2011-05-17 | Disposition: A | Discharge status: Home / Self Care | Payer: BC Managed Care – PPO | Source: Ambulatory Visit | Attending: Radiation Oncology | Admitting: Radiation Oncology

## 2011-05-17 ENCOUNTER — Encounter: Payer: Self-pay | Admitting: Radiation Oncology

## 2011-05-17 VITALS — BP 114/75 | HR 96 | Resp 18 | Wt 200.9 lb

## 2011-05-17 DIAGNOSIS — C8590 Non-Hodgkin lymphoma, unspecified, unspecified site: Secondary | ICD-10-CM

## 2011-05-17 NOTE — Progress Notes (Signed)
Patient presents to the clinic today for under treat visit with Dr. Dayton Scrape. Patient is alert and oriented to person, place, and time. No distress noted. Steady gait noted. Patient denies pain at this time. Patient reports that Carafate is helping. Patient has no complaints at this time. Reported all findings to Dr. Dayton Scrape.

## 2011-05-17 NOTE — Progress Notes (Signed)
Weekly Management Note:  Site:Mediastinum Current Dose:  2450  cGy Projected Dose: 3300  cGy  Narrative: The patient is seen today for routine under treatment assessment. CBCT/MVCT images/port films were reviewed. The chart was reviewed.   He was started on Carafate slurry by Dr. Mitzi Hansen this past Friday. His odynophagia is improved. His weight is stable  Physical Examination:  Filed Vitals:   05/17/11 0947  BP: 114/75  Pulse: 96  Resp: 18  .  Weight: 200 lb 14.4 oz (91.128 kg). Lungs clear.  Impression: Tolerating radiation therapy well.  Plan: Continue radiation therapy as planned.

## 2011-05-18 ENCOUNTER — Ambulatory Visit
Admission: RE | Admit: 2011-05-18 | Discharge: 2011-05-18 | Disposition: A | Discharge status: Home / Self Care | Payer: BC Managed Care – PPO | Source: Ambulatory Visit | Attending: Radiation Oncology | Admitting: Radiation Oncology

## 2011-05-19 ENCOUNTER — Ambulatory Visit
Admission: RE | Admit: 2011-05-19 | Discharge: 2011-05-19 | Disposition: A | Discharge status: Home / Self Care | Payer: BC Managed Care – PPO | Source: Ambulatory Visit | Attending: Radiation Oncology | Admitting: Radiation Oncology

## 2011-05-20 ENCOUNTER — Ambulatory Visit
Admission: RE | Admit: 2011-05-20 | Discharge: 2011-05-20 | Disposition: A | Discharge status: Home / Self Care | Payer: BC Managed Care – PPO | Source: Ambulatory Visit | Attending: Radiation Oncology | Admitting: Radiation Oncology

## 2011-05-20 ENCOUNTER — Encounter: Payer: Self-pay | Admitting: Radiation Oncology

## 2011-05-20 NOTE — Progress Notes (Signed)
Weekly Management Note:  Site:Mediastinum Current Dose:  2960  cGy Projected Dose: 3300  cGy  Narrative: The patient is seen today for routine under treatment assessment. CBCT/MVCT images/port films were reviewed. The chart was reviewed.   His swallowing remains improved.  Physical Examination: There were no vitals filed for this visit..  Weight:  . No significant skin changes.  Impression: Tolerating radiation therapy well.  Plan: Continue radiation therapy as planned. He'll finish his radiation therapy on Monday and then return here for a followup visit in one month.

## 2011-05-21 ENCOUNTER — Ambulatory Visit
Admission: RE | Admit: 2011-05-21 | Discharge: 2011-05-21 | Disposition: A | Discharge status: Home / Self Care | Payer: BC Managed Care – PPO | Source: Ambulatory Visit | Attending: Radiation Oncology | Admitting: Radiation Oncology

## 2011-05-24 ENCOUNTER — Encounter: Payer: Self-pay | Admitting: Radiation Oncology

## 2011-05-24 ENCOUNTER — Ambulatory Visit
Admission: RE | Admit: 2011-05-24 | Discharge: 2011-05-24 | Disposition: A | Discharge status: Home / Self Care | Payer: BC Managed Care – PPO | Source: Ambulatory Visit | Attending: Radiation Oncology | Admitting: Radiation Oncology

## 2011-05-24 ENCOUNTER — Other Ambulatory Visit (HOSPITAL_BASED_OUTPATIENT_CLINIC_OR_DEPARTMENT_OTHER): Payer: BC Managed Care – PPO | Admitting: Lab

## 2011-05-24 ENCOUNTER — Telehealth: Payer: Self-pay | Admitting: Oncology

## 2011-05-24 ENCOUNTER — Ambulatory Visit (HOSPITAL_BASED_OUTPATIENT_CLINIC_OR_DEPARTMENT_OTHER): Payer: BC Managed Care – PPO | Admitting: Oncology

## 2011-05-24 VITALS — BP 112/74 | HR 96 | Temp 97.6°F | Ht 72.0 in | Wt 199.5 lb

## 2011-05-24 DIAGNOSIS — I809 Phlebitis and thrombophlebitis of unspecified site: Secondary | ICD-10-CM

## 2011-05-24 DIAGNOSIS — C8589 Other specified types of non-Hodgkin lymphoma, extranodal and solid organ sites: Secondary | ICD-10-CM

## 2011-05-24 DIAGNOSIS — I82409 Acute embolism and thrombosis of unspecified deep veins of unspecified lower extremity: Secondary | ICD-10-CM

## 2011-05-24 DIAGNOSIS — C8590 Non-Hodgkin lymphoma, unspecified, unspecified site: Secondary | ICD-10-CM

## 2011-05-24 LAB — PROTIME-INR
INR: 2.8 (ref 2.00–3.50)
Protime: 33.6 Seconds — ABNORMAL HIGH (ref 10.6–13.4)

## 2011-05-24 NOTE — Telephone Encounter (Signed)
appt made and printed for pt,[name] at [facility] will call the pt with a port removal time  aom

## 2011-05-24 NOTE — Progress Notes (Signed)
OFFICE PROGRESS NOTE   INTERVAL HISTORY:   He returns as scheduled. He will complete radiation today. He denies cough. No left arm edema.  He was seen at [facility] and Dr. Elmon Kirschner does not recommend CNS prophylaxis. [name] and his parents are comfortable with this decision.  Objective:  Vital signs in last 24 hours:  Blood pressure 112/74, pulse 96, temperature 97.6 F (36.4 C), temperature source Oral, height 6' (1.829 m), weight 199 lb 8 oz (90.493 kg).    HEENT: Neck without mass Lymphatics: No cervical, supraclavicular, axillary, or inguinal nodes Resp: Decreased breath sounds at the left lower posterior chest. No respiratory distress. Cardio: Regular rate and rhythm GI: No hepatosplenomegaly Vascular: No leg or arm edema   Portacath/PICC-without erythema  Lab Results:  PT/INR 2.8   Medications: I have reviewed the patient's current medications.  Assessment/Plan: 1. Diffuse large B-cell lymphoma involving a large anterior mediastinal mass, CD20 positive. He is status post cycle 1 CHOP/Rituxan chemotherapy 11/13/2010 (Rituxan was given on 11/14/2010). He completed cycle 2 CHOP/Rituxan 12/04/2010. He completed cycle #3 on October 19. He completed cycle #4 on 01/14/2011 -a restaging PET scan on November 27 shows a marketed improvement in the anterior mediastinal mass/hypermetabolic activity with a small remaining mediastinal mass with mildly increased FDG activity. He completed cycle 6 of CHOP/Rituxan on 02/26/2011. -restaging PET scan 03/25/2011 revealed no abnormal hypermetabolism in the neck, chest, abdomen, or pelvis. The prevascular soft tissue mass was smaller, measuring 1.9 x 3.6 cm -he began radiation consolidation to the chest on 04/27/2011, completed 05/24/2011. 2. Cough secondary to pneumonitis and obstruction from the mediastinal/left upper chest mass, resolved..  3. Left upper extremity deep vein thrombosis diagnosed while hospitalized September 2012. He continues  Coumadin. 4. Postobstructive pneumonitis, status post Avelox therapy.  5. Fever/sweats secondary to number 1, resolved.  6. Status post placement of a right PICC line. The PICC was removed on 11/25/2010.  7. Status post Port-A-Cath placement 11/30/2010.  8. History of tachycardia. An echocardiogram was unremarkable. The tachycardia has improved 9. Upper respiratory infection when he was here on 02/05/2011. He had a persistent cough 02/26/2011. A chest x-ray was negative on 02/26/2011.   Disposition:  He remains in clinical remis. sion from the non-Hodgkin's lymphoma. The radiation consolidation will be completed today.  [name] and his parents have considered the indication for CNS prophylaxis after discussions with a self, Dr. Peggye Pitt, and [facility]. He does not wish to receive CNS prophylaxis.  We will arrange for removal of the Port-A-Cath by Dr. Edwyna Shell. He will discontinue Coumadin 4 days prior to the Port-A-Cath removal and the Coumadin will not be resumed.  He will return for an office visit, CBC, and LDH in 3 months.   Lucile Shutters, MD  05/24/2011  5:27 PM

## 2011-05-24 NOTE — Progress Notes (Signed)
[facility] Radiation Oncology End of Treatment Note  Name:[name]  Date:05/24/2011           ZOX:[id number] DOB:Apr 15, 1980   Status:outpatient    CC: Dr. Shaune Pollack,  Dr. Mancel Bale  REFERRING PHYSICIAN: Dr. Mancel Bale      DIAGNOSIS: Stage I BE non-Hodgkin's lymphoma, diffuse large B-cell type   INDICATION FOR TREATMENT: Curative   TREATMENT DATES: 04/27/2011 through 05/24/2011                          SITE/DOSE:  Mediastinum 3300 cGy 20 sessions                          BEAMS/ENERGY: 10 MV photons parallel opposed anterior and posterior fields with a field reduction after 1600 cGy in 10 sessions                  NARRATIVE:    The patient tolerated treatment well although he had mild esophagitis which was treated with Carafate slurry to use when necessary.                        PLAN: Routine followup in one month. Patient instructed to call if questions or worsening complaints in interim.

## 2011-05-25 ENCOUNTER — Encounter: Payer: Self-pay | Admitting: *Deleted

## 2011-05-25 NOTE — Progress Notes (Signed)
Patient notified at office visit to continue Coumadin 5 mg daily. Stop 4 days prior to [facility] removal and do not resume.

## 2011-05-31 ENCOUNTER — Other Ambulatory Visit: Payer: Self-pay

## 2011-05-31 DIAGNOSIS — C8590 Non-Hodgkin lymphoma, unspecified, unspecified site: Secondary | ICD-10-CM

## 2011-06-07 ENCOUNTER — Ambulatory Visit (HOSPITAL_COMMUNITY)
Admission: RE | Admit: 2011-06-07 | Discharge: 2011-06-07 | Disposition: A | Discharge status: Home / Self Care | Payer: BC Managed Care – PPO | Source: Ambulatory Visit | Attending: Pulmonary Disease | Admitting: Pulmonary Disease

## 2011-06-07 ENCOUNTER — Other Ambulatory Visit (HOSPITAL_COMMUNITY): Payer: Self-pay | Admitting: Pulmonary Disease

## 2011-06-07 DIAGNOSIS — R509 Fever, unspecified: Secondary | ICD-10-CM

## 2011-06-07 DIAGNOSIS — C8589 Other specified types of non-Hodgkin lymphoma, extranodal and solid organ sites: Secondary | ICD-10-CM | POA: Insufficient documentation

## 2011-06-07 DIAGNOSIS — C8590 Non-Hodgkin lymphoma, unspecified, unspecified site: Secondary | ICD-10-CM

## 2011-06-08 ENCOUNTER — Telehealth: Payer: Self-pay | Admitting: *Deleted

## 2011-06-08 ENCOUNTER — Encounter (HOSPITAL_COMMUNITY): Payer: Self-pay | Admitting: Respiratory Therapy

## 2011-06-08 NOTE — Telephone Encounter (Signed)
Patient reports low grade fever last week of 99.3 and spiked to 101.4 on Sunday and 101.6 on Monday. Upper gums are sore and feel "raw". Feels lymph nodes in his neck (bilateral) are swollen and tender. Saw his PCP on 4/1 and had CXR--was OK. Did CBC and blood cultures. Started on Cipro 500 mg bid X 10 days. Temp today 99.7 with Tylenol. Denies any cough or aches or sore throat. Wanted to be sure Dr. Truett Perna was aware. Suggested he continue Cipro, Tylenol prn and use baking soda and warm water rinses. Avoid rough or acidic foods/liquids. Unlikely this is related to his lymphoma. Dr. Truett Perna made aware.

## 2011-06-10 ENCOUNTER — Telehealth: Payer: Self-pay | Admitting: *Deleted

## 2011-06-10 ENCOUNTER — Inpatient Hospital Stay (HOSPITAL_COMMUNITY): Admission: RE | Admit: 2011-06-10 | Payer: BC Managed Care – PPO | Source: Ambulatory Visit

## 2011-06-10 NOTE — Telephone Encounter (Signed)
Patient reports being afebrile without use of Tylenol. Still on Cipro. All his labs came back OK, except WBC was 1.9. Was told he has some thrush and was given mouthwash to use.  Instructed him to call office if condition does not improve or he worsens. He agrees to do so.

## 2011-06-16 ENCOUNTER — Encounter (HOSPITAL_COMMUNITY)
Admission: RE | Admit: 2011-06-16 | Discharge: 2011-06-16 | Disposition: A | Discharge status: Home / Self Care | Payer: BC Managed Care – PPO | Source: Ambulatory Visit | Attending: Thoracic Surgery | Admitting: Thoracic Surgery

## 2011-06-16 ENCOUNTER — Encounter (HOSPITAL_COMMUNITY): Payer: Self-pay

## 2011-06-16 VITALS — BP 125/78 | HR 92 | Temp 98.6°F | Resp 20 | Ht 72.0 in | Wt 197.4 lb

## 2011-06-16 DIAGNOSIS — C8590 Non-Hodgkin lymphoma, unspecified, unspecified site: Secondary | ICD-10-CM

## 2011-06-16 LAB — COMPREHENSIVE METABOLIC PANEL
ALT: 36 U/L (ref 0–53)
AST: 23 U/L (ref 0–37)
Albumin: 4.2 g/dL (ref 3.5–5.2)
Alkaline Phosphatase: 97 U/L (ref 39–117)
BUN: 17 mg/dL (ref 6–23)
CO2: 25 mEq/L (ref 19–32)
Calcium: 9.4 mg/dL (ref 8.4–10.5)
Chloride: 103 mEq/L (ref 96–112)
Creatinine, Ser: 0.87 mg/dL (ref 0.50–1.35)
GFR calc Af Amer: >90 mL/min (ref >90)
GFR calc non Af Amer: >90 mL/min (ref >90)
Glucose, Bld: 84 mg/dL (ref 70–99)
Potassium: 3.9 mEq/L (ref 3.5–5.1)
Sodium: 138 mEq/L (ref 135–145)
Total Bilirubin: 0.2 mg/dL — ABNORMAL LOW (ref 0.3–1.2)
Total Protein: 6.8 g/dL (ref 6.0–8.3)

## 2011-06-16 LAB — PROTIME-INR
INR: 3.03 — ABNORMAL HIGH (ref 0.00–1.49)
Prothrombin Time: 31.9 seconds — ABNORMAL HIGH (ref 11.6–15.2)

## 2011-06-16 LAB — CBC
HCT: 42.5 % (ref 39.0–52.0)
Hemoglobin: 15.2 g/dL (ref 13.0–17.0)
MCH: 31 pg (ref 26.0–34.0)
MCHC: 35.8 g/dL (ref 30.0–36.0)
MCV: 86.7 fL (ref 78.0–100.0)
Platelets: 293 10*3/uL (ref 150–400)
RBC: 4.9 MIL/uL (ref 4.22–5.81)
RDW: 12.5 % (ref 11.5–15.5)
WBC: 4.9 10*3/uL (ref 4.0–10.5)

## 2011-06-16 LAB — SURGICAL PCR SCREEN
MRSA, PCR: NEGATIVE
Staphylococcus aureus: NEGATIVE

## 2011-06-16 LAB — APTT: aPTT: 52 seconds — ABNORMAL HIGH (ref 24–37)

## 2011-06-16 NOTE — Pre-Procedure Instructions (Signed)
20 [name]  06/16/2011   Your procedure is scheduled on:  April 16  Report to [facility] at 0530 AM.  Call this number if you have problems the morning of surgery: [phone]   Remember:   Do not eat food:After Midnight.  May have clear liquids: up to 4 Hours before arrival.  Clear liquids include soda, tea, black coffee, apple or grape juice, broth.  Take these medicines the morning of surgery with A SIP OF WATER: Tylenol, oxycodone, ranitidine    STOP coumadin as directed  Do not wear jewelry, make-up or nail polish.  Do not wear lotions, powders, or perfumes. You may wear deodorant.  Do not shave 48 hours prior to surgery.  Do not bring valuables to the hospital.  Contacts, dentures or bridgework may not be worn into surgery.  Leave suitcase in the car. After surgery it may be brought to your room.  For patients admitted to the hospital, checkout time is 11:00 AM the day of discharge.   Patients discharged the day of surgery will not be allowed to drive home.  Name and phone number of your driver: Parents  Special Instructions: CHG Shower Use Special Wash: 1/2 bottle night before surgery and 1/2 bottle morning of surgery.   Please read over the following fact sheets that you were given: Pain Booklet, Coughing and Deep Breathing and Surgical Site Infection Prevention

## 2011-06-21 MED ORDER — CEFUROXIME SODIUM 1.5 G IJ SOLR
1.5000 g | INTRAMUSCULAR | Status: DC
  Filled 2011-06-21: qty 1.5

## 2011-06-22 ENCOUNTER — Ambulatory Visit (HOSPITAL_COMMUNITY): Payer: BC Managed Care – PPO

## 2011-06-22 ENCOUNTER — Encounter (HOSPITAL_COMMUNITY): Payer: Self-pay | Admitting: *Deleted

## 2011-06-22 ENCOUNTER — Encounter (HOSPITAL_COMMUNITY): Payer: Self-pay | Admitting: Anesthesiology

## 2011-06-22 ENCOUNTER — Encounter (HOSPITAL_COMMUNITY)
Admission: RE | Disposition: A | Discharge status: Home / Self Care | Payer: Self-pay | Source: Ambulatory Visit | Attending: Thoracic Surgery

## 2011-06-22 ENCOUNTER — Ambulatory Visit (HOSPITAL_COMMUNITY)
Admission: RE | Admit: 2011-06-22 | Discharge: 2011-06-22 | Disposition: A | Discharge status: Home / Self Care | Payer: BC Managed Care – PPO | Source: Ambulatory Visit | Attending: Thoracic Surgery | Admitting: Thoracic Surgery

## 2011-06-22 ENCOUNTER — Ambulatory Visit (HOSPITAL_COMMUNITY): Payer: BC Managed Care – PPO | Admitting: Anesthesiology

## 2011-06-22 DIAGNOSIS — C8582 Other specified types of non-Hodgkin lymphoma, intrathoracic lymph nodes: Secondary | ICD-10-CM

## 2011-06-22 DIAGNOSIS — C8590 Non-Hodgkin lymphoma, unspecified, unspecified site: Secondary | ICD-10-CM

## 2011-06-22 DIAGNOSIS — Z01812 Encounter for preprocedural laboratory examination: Secondary | ICD-10-CM | POA: Insufficient documentation

## 2011-06-22 DIAGNOSIS — Z452 Encounter for adjustment and management of vascular access device: Secondary | ICD-10-CM | POA: Insufficient documentation

## 2011-06-22 DIAGNOSIS — C8589 Other specified types of non-Hodgkin lymphoma, extranodal and solid organ sites: Secondary | ICD-10-CM | POA: Insufficient documentation

## 2011-06-22 DIAGNOSIS — Z79899 Other long term (current) drug therapy: Secondary | ICD-10-CM | POA: Insufficient documentation

## 2011-06-22 HISTORY — PX: PORT-A-CATH REMOVAL: SHX5289

## 2011-06-22 LAB — PROTIME-INR
INR: 1.06 (ref 0.00–1.49)
Prothrombin Time: 14 seconds (ref 11.6–15.2)

## 2011-06-22 SURGERY — REMOVAL PORT-A-CATH
Anesthesia: Monitor Anesthesia Care | Site: Chest | Laterality: Right | Wound class: Clean

## 2011-06-22 MED ORDER — FENTANYL CITRATE 0.05 MG/ML IJ SOLN
INTRAMUSCULAR | Status: DC | PRN
  Administered 2011-06-22: 100 ug via INTRAVENOUS

## 2011-06-22 MED ORDER — ACETAMINOPHEN 325 MG PO TABS
650.0000 mg | ORAL_TABLET | ORAL | Status: DC | PRN
  Filled 2011-06-22: qty 2

## 2011-06-22 MED ORDER — LIDOCAINE-EPINEPHRINE (PF) 1 %-1:200000 IJ SOLN
INTRAMUSCULAR | Status: DC | PRN
  Administered 2011-06-22: 15 mL

## 2011-06-22 MED ORDER — SODIUM CHLORIDE 0.9 % IV SOLN: 250.0000 mL | INTRAVENOUS | Status: DC | PRN

## 2011-06-22 MED ORDER — ACETAMINOPHEN 650 MG RE SUPP
650.0000 mg | RECTAL | Status: DC | PRN
  Filled 2011-06-22: qty 1

## 2011-06-22 MED ORDER — SODIUM CHLORIDE 0.9 % IJ SOLN: 3.0000 mL | INTRAMUSCULAR | Status: DC | PRN

## 2011-06-22 MED ORDER — OXYCODONE HCL 5 MG PO TABS: 5.0000 mg | ORAL_TABLET | ORAL | Status: DC | PRN

## 2011-06-22 MED ORDER — PROPOFOL 10 MG/ML IV BOLUS
INTRAVENOUS | Status: DC | PRN
  Administered 2011-06-22 (×5): 20 mg via INTRAVENOUS

## 2011-06-22 MED ORDER — MIDAZOLAM HCL 5 MG/5ML IJ SOLN
INTRAMUSCULAR | Status: DC | PRN
  Administered 2011-06-22: 2 mg via INTRAVENOUS

## 2011-06-22 MED ORDER — MORPHINE SULFATE 2 MG/ML IJ SOLN: 0.0500 mg/kg | INTRAMUSCULAR | Status: DC | PRN

## 2011-06-22 MED ORDER — LACTATED RINGERS IV SOLN
INTRAVENOUS | Status: DC | PRN
  Administered 2011-06-22: 07:00:00 via INTRAVENOUS

## 2011-06-22 MED ORDER — HYDROMORPHONE HCL PF 1 MG/ML IJ SOLN: 0.2500 mg | INTRAMUSCULAR | Status: DC | PRN

## 2011-06-22 MED ORDER — SODIUM CHLORIDE 0.9 % IJ SOLN
3.0000 mL | Freq: Two times a day (BID) | INTRAMUSCULAR | Status: DC
Start: 2011-06-22 — End: 2011-06-22

## 2011-06-22 MED ORDER — FENTANYL CITRATE 0.05 MG/ML IJ SOLN: 25.0000 ug | INTRAMUSCULAR | Status: DC | PRN

## 2011-06-22 MED ORDER — LIDOCAINE HCL (CARDIAC) 20 MG/ML IV SOLN
INTRAVENOUS | Status: DC | PRN
  Administered 2011-06-22: 20 mg via INTRAVENOUS

## 2011-06-22 MED ORDER — ONDANSETRON HCL 4 MG/2ML IJ SOLN
4.0000 mg | Freq: Four times a day (QID) | INTRAMUSCULAR | Status: DC | PRN
  Filled 2011-06-22: qty 2

## 2011-06-22 SURGICAL SUPPLY — 33 items
BLADE SURG 11 STRL SS (BLADE) IMPLANT
CANISTER SUCTION 2500CC (MISCELLANEOUS) ×2 IMPLANT
CLOTH BEACON ORANGE TIMEOUT ST (SAFETY) ×2 IMPLANT
COVER SURGICAL LIGHT HANDLE (MISCELLANEOUS) ×2 IMPLANT
DERMABOND ADHESIVE PROPEN (GAUZE/BANDAGES/DRESSINGS) ×1
DERMABOND ADVANCED (GAUZE/BANDAGES/DRESSINGS) ×1
DERMABOND ADVANCED .7 DNX12 (GAUZE/BANDAGES/DRESSINGS) ×1 IMPLANT
DERMABOND ADVANCED .7 DNX6 (GAUZE/BANDAGES/DRESSINGS) ×1 IMPLANT
DRAPE LAPAROTOMY T 102X78X121 (DRAPES) ×2 IMPLANT
ELECT REM PT RETURN 9FT ADLT (ELECTROSURGICAL) ×2
ELECTRODE REM PT RTRN 9FT ADLT (ELECTROSURGICAL) ×1 IMPLANT
GLOVE BIOGEL PI IND STRL 6.5 (GLOVE) ×1 IMPLANT
GLOVE BIOGEL PI IND STRL 7.0 (GLOVE) ×1 IMPLANT
GLOVE BIOGEL PI INDICATOR 6.5 (GLOVE) ×1
GLOVE BIOGEL PI INDICATOR 7.0 (GLOVE) ×1
GLOVE ECLIPSE 6.5 STRL STRAW (GLOVE) ×2 IMPLANT
GLOVE SURG SIGNA 7.5 PF LTX (GLOVE) ×2 IMPLANT
GOWN BRE IMP PREV XXLGXLNG (GOWN DISPOSABLE) ×2 IMPLANT
GOWN STRL NON-REIN LRG LVL3 (GOWN DISPOSABLE) ×2 IMPLANT
KIT BASIN OR (CUSTOM PROCEDURE TRAY) ×2 IMPLANT
KIT ROOM TURNOVER OR (KITS) ×2 IMPLANT
NEEDLE 22X1 1/2 (OR ONLY) (NEEDLE) ×2 IMPLANT
NS IRRIG 1000ML POUR BTL (IV SOLUTION) ×2 IMPLANT
PACK GENERAL/GYN (CUSTOM PROCEDURE TRAY) ×2 IMPLANT
PAD ARMBOARD 7.5X6 YLW CONV (MISCELLANEOUS) ×4 IMPLANT
SPONGE GAUZE 4X4 12PLY (GAUZE/BANDAGES/DRESSINGS) ×2 IMPLANT
SUT VIC AB 3-0 SH 27 (SUTURE) ×1
SUT VIC AB 3-0 SH 27X BRD (SUTURE) ×1 IMPLANT
SYR CONTROL 10ML LL (SYRINGE) ×2 IMPLANT
TAPE CLOTH SURG 4X10 WHT LF (GAUZE/BANDAGES/DRESSINGS) ×2 IMPLANT
TOWEL OR 17X24 6PK STRL BLUE (TOWEL DISPOSABLE) ×2 IMPLANT
TOWEL OR 17X26 10 PK STRL BLUE (TOWEL DISPOSABLE) ×2 IMPLANT
WATER STERILE IRR 1000ML POUR (IV SOLUTION) IMPLANT

## 2011-06-22 NOTE — H&P (View-Only) (Signed)
OFFICE PROGRESS NOTE   INTERVAL HISTORY:   He returns as scheduled. He will complete radiation today. He denies cough. No left arm edema.  He was seen at [facility] and Dr. Elmon Kirschner does not recommend CNS prophylaxis. Mr. Parco and his parents are comfortable with this decision.  Objective:  Vital signs in last 24 hours:  Blood pressure 112/74, pulse 96, temperature 97.6 F (36.4 C), temperature source Oral, height 6' (1.829 m), weight 199 lb 8 oz (90.493 kg).    HEENT: Neck without mass Lymphatics: No cervical, supraclavicular, axillary, or inguinal nodes Resp: Decreased breath sounds at the left lower posterior chest. No respiratory distress. Cardio: Regular rate and rhythm GI: No hepatosplenomegaly Vascular: No leg or arm edema   Portacath/PICC-without erythema  Lab Results:  PT/INR 2.8   Medications: I have reviewed the patient's current medications.  Assessment/Plan: 1. Diffuse large B-cell lymphoma involving a large anterior mediastinal mass, CD20 positive. He is status post cycle 1 CHOP/Rituxan chemotherapy 11/13/2010 (Rituxan was given on 11/14/2010). He completed cycle 2 CHOP/Rituxan 12/04/2010. He completed cycle #3 on October 19. He completed cycle #4 on 01/14/2011 -a restaging PET scan on November 27 shows a marketed improvement in the anterior mediastinal mass/hypermetabolic activity with a small remaining mediastinal mass with mildly increased FDG activity. He completed cycle 6 of CHOP/Rituxan on 02/26/2011. -restaging PET scan 03/25/2011 revealed no abnormal hypermetabolism in the neck, chest, abdomen, or pelvis. The prevascular soft tissue mass was smaller, measuring 1.9 x 3.6 cm -he began radiation consolidation to the chest on 04/27/2011, completed 05/24/2011. 2. Cough secondary to pneumonitis and obstruction from the mediastinal/left upper chest mass, resolved..  3. Left upper extremity deep vein thrombosis diagnosed while hospitalized September 2012. He continues  Coumadin. 4. Postobstructive pneumonitis, status post Avelox therapy.  5. Fever/sweats secondary to number 1, resolved.  6. Status post placement of a right PICC line. The PICC was removed on 11/25/2010.  7. Status post Port-A-Cath placement 11/30/2010.  8. History of tachycardia. An echocardiogram was unremarkable. The tachycardia has improved 9. Upper respiratory infection when he was here on 02/05/2011. He had a persistent cough 02/26/2011. A chest x-ray was negative on 02/26/2011.   Disposition:  He remains in clinical remis. sion from the non-Hodgkin's lymphoma. The radiation consolidation will be completed today.  [name] and his parents have considered the indication for CNS prophylaxis after discussions with a self, Dr. Peggye Pitt, and [facility]. He does not wish to receive CNS prophylaxis.  We will arrange for removal of the Port-A-Cath by Dr. Edwyna Shell. He will discontinue Coumadin 4 days prior to the Port-A-Cath removal and the Coumadin will not be resumed.  He will return for an office visit, CBC, and LDH in 3 months.   Lucile Shutters, MD  05/24/2011  5:27 PM

## 2011-06-22 NOTE — Anesthesia Preprocedure Evaluation (Addendum)
Anesthesia Evaluation  Patient identified by MRN, date of birth, ID band Patient awake    Reviewed: Allergy & Precautions, H&P , NPO status , Patient's Chart, lab work & pertinent test results  Airway Mallampati: II TM Distance: >3 FB     Dental  (+) Teeth Intact   Pulmonary  breath sounds clear to auscultation        Cardiovascular negative cardio ROS  Rhythm:Regular Rate:Normal     Neuro/Psych  Headaches,    GI/Hepatic Neg liver ROS, GERD-  Controlled,  Endo/Other    Renal/GU negative Renal ROS     Musculoskeletal   Abdominal   Peds  Hematology negative hematology ROS (+)   Anesthesia Other Findings   Reproductive/Obstetrics                          Anesthesia Physical Anesthesia Plan  ASA: II  Anesthesia Plan: MAC   Post-op Pain Management:    Induction: Intravenous  Airway Management Planned: Simple Face Mask  Additional Equipment:   Intra-op Plan:   Post-operative Plan:   Informed Consent: I have reviewed the patients History and Physical, chart, labs and discussed the procedure including the risks, benefits and alternatives for the proposed anesthesia with the patient or authorized representative who has indicated his/her understanding and acceptance.   Dental advisory given  Plan Discussed with: CRNA and Anesthesiologist  Anesthesia Plan Comments:         Anesthesia Quick Evaluation

## 2011-06-22 NOTE — Progress Notes (Signed)
Patients mom stated dr Remo Lipps office will call with appt.

## 2011-06-22 NOTE — Op Note (Signed)
NAMEELWYN, KLOSINSKI NO.:  [id number]  MEDICAL RECORD NO.:  [id number]  LOCATION:  [facility]                         FACILITY:  [facility]  PHYSICIAN:  Ines Bloomer, M.D. DATE OF BIRTH:  January 15, 1981  DATE OF PROCEDURE: DATE OF DISCHARGE:                              OPERATIVE REPORT   PREOPERATIVE DIAGNOSIS:  Status post chemotherapy for lymphoma.  POSTOPERATIVE DIAGNOSIS:  Status post chemotherapy for lymphoma.  OPERATION PERFORMED:  Removal of right IJ Port-A-Cath.  SURGEON:  Ines Bloomer, MD  ANESTHESIA:  Xylocaine 1% anesthesia and IV sedation.  DESCRIPTION OF PROCEDURE:  After IV sedation, the previous scar was infiltrated with 1% Xylocaine and excised and dissection was carried down to the Port-A-Cath tubing and then dissected down to the Port-A- Cath where it was.  The pocket was opened and the Port-A-Cath reservoir was dissected out, and the Port-A-Cath was removed including the tubing. It was then closed with 3-0 Vicryl in subcutaneous tissue, and 3-0 Vicryl subcuticular stitch and Dermabond for the skin.  The patient tolerated the procedure well, and was returned to recovery room in stable condition.     Ines Bloomer, M.D.     DPB/MEDQ  D:  06/22/2011  T:  06/22/2011  Job:  [id number]

## 2011-06-22 NOTE — Interval H&P Note (Signed)
History and Physical Interval Note:  06/22/2011 7:08 AM  [name]  has presented today for surgery, with the diagnosis of LYMPHOMA  The various methods of treatment have been discussed with the patient and family. After consideration of risks, benefits and other options for treatment, the patient has consented to  Procedure(s) (LRB): REMOVAL PORT-A-CATH (Right) as a surgical intervention .  The patients' history has been reviewed, patient examined, no change in status, stable for surgery.  I have reviewed the patients' chart and labs.  Questions were answered to the patient's satisfaction.     Cameron Proud

## 2011-06-22 NOTE — Transfer of Care (Signed)
Immediate Anesthesia Transfer of Care Note  Patient: [name]  Procedure(s) Performed: Procedure(s) (LRB): REMOVAL PORT-A-CATH (Right)  Patient Location: PACU  Anesthesia Type: MAC  Level of Consciousness: awake, alert  and oriented  Airway & Oxygen Therapy: Patient Spontanous Breathing  Post-op Assessment: Report given to PACU RN and Post -op Vital signs reviewed and stable  Post vital signs: Reviewed and stable  Complications: No apparent anesthesia complications

## 2011-06-22 NOTE — Anesthesia Procedure Notes (Signed)
Procedure Name: MAC Date/Time: 06/22/2011 7:25 AM Performed by: Marena Chancy Pre-anesthesia Checklist: Patient identified, Emergency Drugs available, Suction available, Patient being monitored and Timeout performed Patient Re-evaluated:Patient Re-evaluated prior to inductionOxygen Delivery Method: Nasal cannula Intubation Type: IV induction

## 2011-06-22 NOTE — Brief Op Note (Signed)
06/22/2011  7:56 AM  PATIENT:  [name]  31 y.o. male  PRE-OPERATIVE DIAGNOSIS:  LYMPHOMA  POST-OPERATIVE DIAGNOSIS:  LYMPHOMA  PROCEDURE:  Procedure(s) (LRB): REMOVAL PORT-A-CATH (Right)  SURGEON:  Surgeon(s) and Role:    * Ines Bloomer, MD - Primary  PHYSICIAN ASSISTANT:   ASSISTANTS: none   ANESTHESIA:   local  EBL:     BLOOD ADMINISTERED:none  DRAINS: none   LOCAL MEDICATIONS USED:  LIDOCAINE   SPECIMEN:  No Specimen  DISPOSITION OF SPECIMEN:  N/A  COUNTS:  YES  TOURNIQUET:  * No tourniquets in log *  DICTATION: .Other Dictation: Dictation Number [id number]  PLAN OF CARE: Discharge to home after PACU  PATIENT DISPOSITION:  PACU - hemodynamically stable.   Delay start of Pharmacological VTE agent (>24hrs) due to surgical blood loss or risk of bleeding: yes

## 2011-06-22 NOTE — Anesthesia Postprocedure Evaluation (Signed)
  Anesthesia Post-op Note  Patient: [name]  Procedure(s) Performed: Procedure(s) (LRB): REMOVAL PORT-A-CATH (Right)  Patient Location: PACU  Anesthesia Type: MAC  Level of Consciousness: awake  Airway and Oxygen Therapy: Patient Spontanous Breathing  Post-op Pain: mild  Post-op Assessment: Post-op Vital signs reviewed  Post-op Vital Signs: Reviewed  Complications: No apparent anesthesia complications

## 2011-06-22 NOTE — Preoperative (Signed)
Beta Blockers   Reason not to administer Beta Blockers:Not Applicable 

## 2011-06-23 ENCOUNTER — Ambulatory Visit
Admission: RE | Admit: 2011-06-23 | Discharge: 2011-06-23 | Disposition: A | Discharge status: Home / Self Care | Payer: BC Managed Care – PPO | Source: Ambulatory Visit | Attending: Radiation Oncology | Admitting: Radiation Oncology

## 2011-06-23 ENCOUNTER — Encounter: Payer: Self-pay | Admitting: Radiation Oncology

## 2011-06-23 VITALS — BP 108/74 | HR 90 | Temp 97.3°F | Resp 18 | Wt 195.4 lb

## 2011-06-23 DIAGNOSIS — C8590 Non-Hodgkin lymphoma, unspecified, unspecified site: Secondary | ICD-10-CM

## 2011-06-23 NOTE — Progress Notes (Signed)
Followup note:  Mr.Cardosa returns today approximately 1 month following completion of consolidative radiation therapy in the management of his stage I BE non-Hodgkin's lymphoma, diffuse large B-cell type involving the mediastinum. He is doing quite well. He had his Port-A-Cath removed yesterday by Dr. Edwyna Shell. He plans to see Dr. Truett Perna for a followup visit next month. He denies B. symptoms. He'll not have CNS prophylaxis.  Physical examination: Alert and oriented. Vital signs within normal limits.  Nodes: There is no palpable cervical, supraclavicular, or axillary lymphadenopathy. Chest: Diminished breath sounds left base, unchanged. Heart: Regular rhythm. Abdomen: Without masses organomegaly. Extremities without edema. Neurologic examination grossly nonfocal.  Impression: Satisfactory progress.  Plan: Followup through Dr. Truett Perna will obtain periodic chest x-rays and CT scans.

## 2011-06-23 NOTE — Progress Notes (Signed)
Patient presents to the clinic today unaccompanied for a follow up appointment with Dr. Dayton Scrape. Patient is alert and oriented to person, place, and time. No distress noted. Steady gait noted. Pleasant affect noted. Patient denies pain at this time. Patient reports that he will see Dr. Truett Perna next month. Patient had porta cath removed yesterday. Patient reports eating and sleeping without difficulty. Patient denies nausea, vomiting, headache, dizziness or diarrhea. Patient denies reflux but takes zantac daily. Patient has no complaints at this time. Reported all findings to Dr. Dayton Scrape.

## 2011-06-29 ENCOUNTER — Encounter: Payer: Self-pay | Admitting: Thoracic Surgery

## 2011-06-29 ENCOUNTER — Ambulatory Visit (INDEPENDENT_AMBULATORY_CARE_PROVIDER_SITE_OTHER): Payer: BC Managed Care – PPO | Admitting: Thoracic Surgery

## 2011-06-29 VITALS — BP 136/79 | HR 80 | Resp 20 | Ht 72.0 in | Wt 197.0 lb

## 2011-06-29 DIAGNOSIS — Z09 Encounter for follow-up examination after completed treatment for conditions other than malignant neoplasm: Secondary | ICD-10-CM

## 2011-06-29 DIAGNOSIS — C8582 Other specified types of non-Hodgkin lymphoma, intrathoracic lymph nodes: Secondary | ICD-10-CM

## 2011-06-29 NOTE — Progress Notes (Signed)
HPI patient returns after Port-A-Cath removal. His incision is well-healed. He is gone back to hunting.We will see him again as needed.Current Outpatient Prescriptions  Medication Sig Dispense Refill  . acetaminophen (TYLENOL) 650 MG CR tablet Take 650 mg by mouth every 4 (four) hours as needed.        Marland Kitchen oxyCODONE-acetaminophen (PERCOCET) 5-325 MG per tablet Take 1-2 tablets by mouth every 4 (four) hours as needed.  30 tablet  0  . ranitidine (ZANTAC) 75 MG tablet Take 75 mg by mouth at bedtime.          Review of Systems: Unchanged   Physical Exam lungs are clear to auscultation percussion   Diagnostic Tests: None   Impression: Port-A-Cath removal status post chemotherapy for lymphoma  Plan: Return as needed

## 2011-08-02 ENCOUNTER — Ambulatory Visit (HOSPITAL_COMMUNITY)
Admission: RE | Admit: 2011-08-02 | Discharge: 2011-08-02 | Disposition: A | Discharge status: Home / Self Care | Payer: BC Managed Care – PPO | Source: Ambulatory Visit | Attending: Family Medicine | Admitting: Family Medicine

## 2011-08-02 ENCOUNTER — Ambulatory Visit (INDEPENDENT_AMBULATORY_CARE_PROVIDER_SITE_OTHER): Payer: BC Managed Care – PPO | Admitting: Family Medicine

## 2011-08-02 VITALS — BP 120/81 | HR 87 | Temp 98.2°F | Resp 16 | Ht 71.5 in | Wt 200.2 lb

## 2011-08-02 DIAGNOSIS — M79609 Pain in unspecified limb: Secondary | ICD-10-CM

## 2011-08-02 DIAGNOSIS — M79603 Pain in arm, unspecified: Secondary | ICD-10-CM

## 2011-08-02 NOTE — Progress Notes (Signed)
  Subjective:    Patient ID: [name], male    DOB: June 27, 1980, 30 y.o.   MRN: [id number]  HPI 31 yo male with recent lymphoma and history of left upper extremity DVT here with right upper arm pain.  Deemed in remission from lymphoma.  Portacath has been taken out. Previous DVT was in September.  Was on lovenox nad then coumadin (5mg  daily) until early April. Feels similar to when he had the DVT previously.  Did though start working out at the gym last week.  Lifted weights 4 times.   Doesn't feel swollen but veins seem "more poked out".   Review of Systems Negative except as per HPI     Objective:   Physical Exam  Constitutional: He appears well-developed and well-nourished.  Cardiovascular: Normal rate, regular rhythm, normal heart sounds and intact distal pulses.   No murmur heard. Pulmonary/Chest: Effort normal and breath sounds normal.  Neurological: He is alert.  Skin: Skin is warm and dry.    Bilateral upper arms symmetric.  Veins clearly visible in both upper extremities.  FROM right arm/shoulder.  Good pulses.  No erythema.  Mild TTP over bicep.       Assessment & Plan:  Arm pain - likely DOMS.  However, given his history, must rule out possible DVT.  Will send for doppler now.  If positive, will call out lovenox and coumadin.  He remembers how to inject the lovenox.  If negative, aleve, ice, rest.

## 2011-08-03 NOTE — Progress Notes (Signed)
*  PRELIMINARY RESULTS* Vascular Ultrasound Right upper extremity venous duplex has been completed.  Preliminary findings: Right= No evidence of DVT or SVT.  Farrel Demark RDMS 08/03/2011, 11:24 AM

## 2011-08-24 ENCOUNTER — Other Ambulatory Visit (HOSPITAL_BASED_OUTPATIENT_CLINIC_OR_DEPARTMENT_OTHER): Payer: BC Managed Care – PPO | Admitting: Lab

## 2011-08-24 ENCOUNTER — Telehealth: Payer: Self-pay | Admitting: Oncology

## 2011-08-24 ENCOUNTER — Ambulatory Visit (HOSPITAL_BASED_OUTPATIENT_CLINIC_OR_DEPARTMENT_OTHER): Payer: BC Managed Care – PPO | Admitting: Oncology

## 2011-08-24 VITALS — BP 117/53 | HR 74 | Temp 97.3°F | Ht 71.5 in | Wt 206.8 lb

## 2011-08-24 DIAGNOSIS — C8590 Non-Hodgkin lymphoma, unspecified, unspecified site: Secondary | ICD-10-CM

## 2011-08-24 DIAGNOSIS — J189 Pneumonia, unspecified organism: Secondary | ICD-10-CM

## 2011-08-24 DIAGNOSIS — C8589 Other specified types of non-Hodgkin lymphoma, extranodal and solid organ sites: Secondary | ICD-10-CM

## 2011-08-24 DIAGNOSIS — I82629 Acute embolism and thrombosis of deep veins of unspecified upper extremity: Secondary | ICD-10-CM

## 2011-08-24 LAB — CBC WITH DIFFERENTIAL/PLATELET
BASO%: 0.6 % (ref 0.0–2.0)
Basophils Absolute: 0 10*3/uL (ref 0.0–0.1)
EOS%: 1.1 % (ref 0.0–7.0)
Eosinophils Absolute: 0 10*3/uL (ref 0.0–0.5)
HCT: 44.8 % (ref 38.4–49.9)
HGB: 15.8 g/dL (ref 13.0–17.1)
LYMPH%: 11.6 % — ABNORMAL LOW (ref 14.0–49.0)
MCH: 33 pg (ref 27.2–33.4)
MCHC: 35.3 g/dL (ref 32.0–36.0)
MCV: 93.4 fL (ref 79.3–98.0)
MONO#: 0.4 10*3/uL (ref 0.1–0.9)
MONO%: 9.5 % (ref 0.0–14.0)
NEUT#: 3.7 10*3/uL (ref 1.5–6.5)
NEUT%: 77.2 % — ABNORMAL HIGH (ref 39.0–75.0)
Platelets: 245 10*3/uL (ref 140–400)
RBC: 4.8 10*6/uL (ref 4.20–5.82)
RDW: 13.6 % (ref 11.0–14.6)
WBC: 4.7 10*3/uL (ref 4.0–10.3)
lymph#: 0.5 10*3/uL — ABNORMAL LOW (ref 0.9–3.3)

## 2011-08-24 LAB — LACTATE DEHYDROGENASE: LDH: 118 U/L (ref 94–250)

## 2011-08-24 NOTE — Progress Notes (Addendum)
   [facility]    OFFICE PROGRESS NOTE   INTERVAL HISTORY:   He returns as scheduled. He has returned to work. Stable mild exertional dyspnea. He had an upper respiratory infection in April. A chest x-ray on 06/22/2011 confirmed stable elevation of the left hemidiaphragm the mild left basilar atelectasis or scarring. No pleural effusion.  Objective:  Vital signs in last 24 hours:  Blood pressure 117/53, pulse 74, temperature 97.3 F (36.3 C), temperature source Oral, height 5' 11.5" (1.816 m), weight 206 lb 12.8 oz (93.804 kg).    HEENT: Oropharynx without visible mass, neck without mass Lymphatics: No cervical, supraclavicular, axillary, or inguinal nodes Resp: Decreased breath sounds at the left compared to the right chest, no respiratory distress Cardio: Regular rate and rhythm GI: No hepatosplenomegaly Vascular: No leg edema      Lab Results:  Lab Results  Component Value Date   WBC 4.7 08/24/2011   HGB 15.8 08/24/2011   HCT 44.8 08/24/2011   MCV 93.4 08/24/2011   PLT 245 08/24/2011   ANC 3.7    Medications: I have reviewed the patient's current medications.  Assessment/Plan: 1. Diffuse large B-cell lymphoma involving a large anterior mediastinal mass, CD20 positive. He is status post cycle 1 CHOP/Rituxan chemotherapy 11/13/2010 (Rituxan was given on 11/14/2010). He completed cycle 2 CHOP/Rituxan 12/04/2010. He completed cycle #3 on October 19. He completed cycle #4 on 01/14/2011 -a restaging PET scan on November 27 shows a marketed improvement in the anterior mediastinal mass/hypermetabolic activity with a small remaining mediastinal mass with mildly increased FDG activity. He completed cycle 6 of CHOP/Rituxan on 02/26/2011. -restaging PET scan 03/25/2011 revealed no abnormal hypermetabolism in the neck, chest, abdomen, or pelvis. The prevascular soft tissue mass was smaller, measuring 1.9 x 3.6 cm -he began radiation consolidation to the chest on  04/27/2011, completed 05/24/2011. 2. Cough secondary to pneumonitis and obstruction from the mediastinal/left upper chest mass, resolved..  3. Left upper extremity deep vein thrombosis diagnosed while hospitalized September 2012. Coumadin was discontinued when the Port-A-Cath was removed in April of 2013. 4. Postobstructive pneumonitis, status post Avelox therapy.  5. Fever/sweats secondary to number 1, resolved.  6. Status post placement of a right PICC line. The PICC was removed on 11/25/2010.  7. Status post Port-A-Cath placement 11/30/2010, removed 06/22/2011 8. ,History of tachycardia. An echocardiogram was unremarkable. The tachycardia has improved 9. Upper respiratory infection when he was here on 02/05/2011. He had a persistent cough 02/26/2011. A chest x-ray was negative on 02/26/2011.    Disposition:  He remains in clinical remission from the non-Hodgkin's lymphoma. [name] will return for an office visit in 3 months. He will be scheduled for an influenza vaccine and pneumococcal vaccine when he returns in 3 months. He believes that he may have received a pneumococcal vaccine while in the hospital. We will confirm this.  Addendum: He received a pneumococcal vaccine while in the hospital on 11/13/2010.  Thornton Papas, MD  08/24/2011  3:42 PM

## 2011-08-24 NOTE — Telephone Encounter (Signed)
appts made and printed for pt aom °

## 2011-11-18 ENCOUNTER — Ambulatory Visit (HOSPITAL_COMMUNITY)
Admission: RE | Admit: 2011-11-18 | Discharge: 2011-11-18 | Disposition: A | Discharge status: Home / Self Care | Payer: BC Managed Care – PPO | Source: Ambulatory Visit | Attending: Pulmonary Disease | Admitting: Pulmonary Disease

## 2011-11-18 ENCOUNTER — Other Ambulatory Visit (HOSPITAL_COMMUNITY): Payer: Self-pay | Admitting: Pulmonary Disease

## 2011-11-18 DIAGNOSIS — R0989 Other specified symptoms and signs involving the circulatory and respiratory systems: Secondary | ICD-10-CM

## 2011-11-18 DIAGNOSIS — R059 Cough, unspecified: Secondary | ICD-10-CM | POA: Insufficient documentation

## 2011-11-18 DIAGNOSIS — R05 Cough: Secondary | ICD-10-CM | POA: Insufficient documentation

## 2011-11-23 ENCOUNTER — Ambulatory Visit (HOSPITAL_BASED_OUTPATIENT_CLINIC_OR_DEPARTMENT_OTHER): Payer: BC Managed Care – PPO | Admitting: Oncology

## 2011-11-23 ENCOUNTER — Other Ambulatory Visit: Payer: BC Managed Care – PPO

## 2011-11-23 ENCOUNTER — Telehealth: Payer: Self-pay | Admitting: Oncology

## 2011-11-23 VITALS — BP 106/75 | HR 65 | Temp 97.6°F | Resp 20 | Ht 71.5 in | Wt 202.1 lb

## 2011-11-23 DIAGNOSIS — C8590 Non-Hodgkin lymphoma, unspecified, unspecified site: Secondary | ICD-10-CM

## 2011-11-23 DIAGNOSIS — C8589 Other specified types of non-Hodgkin lymphoma, extranodal and solid organ sites: Secondary | ICD-10-CM

## 2011-11-23 DIAGNOSIS — Z23 Encounter for immunization: Secondary | ICD-10-CM

## 2011-11-23 MED ORDER — INFLUENZA VIRUS VACC SPLIT PF IM SUSP
0.5000 mL | Freq: Once | INTRAMUSCULAR | Status: AC
  Administered 2011-11-23: 0.5 mL via INTRAMUSCULAR
  Filled 2011-11-23: qty 0.5

## 2011-11-23 NOTE — Patient Instructions (Signed)
[facility] Discharge Instructions  Your exam findings, labs and results were discussed with your MD today.   Please visit scheduling to obtain calendar for future appointments.  Please call the [facility] at [phone] during business hours should you have any further questions or need assistance in obtaining follow-up care. If you have a medical emergency, please dial 911.  Special Instructions:         

## 2011-11-23 NOTE — Progress Notes (Signed)
   [facility]    OFFICE PROGRESS NOTE   INTERVAL HISTORY:   He returns as scheduled. He had an upper respiratory infection last week with a cough, congestion, and low-grade fever. He was treated with azithromycin. These symptoms have resolved. A chest x-ray revealed bibasilar atelectasis.  He denies fever, night sweats, anorexia. He is working full-time. The exertional dyspnea has improved.  Objective:  Vital signs in last 24 hours:  Blood pressure 106/75, pulse 65, temperature 97.6 F (36.4 C), temperature source Oral, resp. rate 20, height 5' 11.5" (1.816 m), weight 202 lb 1.6 oz (91.672 kg).    HEENT: Neck without mass Lymphatics: No cervical, supraclavicular, axillary, or inguinal nodes Resp: Lungs clear bilaterally with a slight decrease in breath sounds at the left posterior base, no respiratory distress Cardio: Regular rate and rhythm GI: No hepatosplenomegaly Vascular: No leg edema    Medications: I have reviewed the patient's current medications.  Assessment/Plan: 1. Diffuse large B-cell lymphoma involving a large anterior mediastinal mass, CD20 positive. He is status post cycle 1 CHOP/Rituxan chemotherapy 11/13/2010 (Rituxan was given on 11/14/2010). He completed cycle 2 CHOP/Rituxan 12/04/2010. He completed cycle #3 on October 19. He completed cycle #4 on 01/14/2011 -a restaging PET scan on November 27 shows a marketed improvement in the anterior mediastinal mass/hypermetabolic activity with a small remaining mediastinal mass with mildly increased FDG activity. He completed cycle 6 of CHOP/Rituxan on 02/26/2011. -restaging PET scan 03/25/2011 revealed no abnormal hypermetabolism in the neck, chest, abdomen, or pelvis. The prevascular soft tissue mass was smaller, measuring 1.9 x 3.6 cm -he began radiation consolidation to the chest on 04/27/2011, completed 05/24/2011. 2. Cough secondary to pneumonitis and obstruction from the mediastinal/left upper chest  mass, resolved..  3. Left upper extremity deep vein thrombosis diagnosed while hospitalized September 2012. Coumadin was discontinued when the Port-A-Cath was removed in April of 2013. 4. Postobstructive pneumonitis, status post Avelox therapy.  5. Fever/sweats secondary to number 1, resolved.  6. Status post placement of a right PICC line. The PICC was removed on 11/25/2010.  7. Status post Port-A-Cath placement 11/30/2010, removed 06/22/2011 8. ,History of tachycardia. An echocardiogram was unremarkable. The tachycardia has improved 9. Upper respiratory infection when he was here on 02/05/2011. He had a persistent cough 02/26/2011. A chest x-ray was negative on 02/26/2011.  10. Upper respiratory infection September 2013, improved following antibiotic therapy,negative chest x-ray 11/18/2011 11. History of left diaphragm paralysis following the diagnosis of non-Hodgkin's lymphoma-clinically improved   Disposition:  He remains in clinical remission from the non-Hodgkin's lymphoma. Mr. Laban will return for an office visit in 3 months. He received an influenza vaccine today. I reviewed the September 2013 chest x-ray with him. The left lung appears better expanded compared to previous x-rays.   Thornton Papas, MD  11/23/2011  12:33 PM

## 2011-11-23 NOTE — Telephone Encounter (Signed)
Gave pt appt for November 2013 lab and MD °

## 2011-11-25 ENCOUNTER — Telehealth: Payer: Self-pay | Admitting: Oncology

## 2011-11-25 NOTE — Telephone Encounter (Signed)
Called pt left message regarding appt moved to December per MD

## 2012-01-14 ENCOUNTER — Other Ambulatory Visit: Payer: BC Managed Care – PPO | Admitting: Lab

## 2012-01-14 ENCOUNTER — Ambulatory Visit: Payer: BC Managed Care – PPO | Admitting: Oncology

## 2012-02-14 ENCOUNTER — Telehealth: Payer: Self-pay | Admitting: Oncology

## 2012-02-14 ENCOUNTER — Other Ambulatory Visit: Payer: BC Managed Care – PPO | Admitting: Lab

## 2012-02-14 ENCOUNTER — Ambulatory Visit (HOSPITAL_BASED_OUTPATIENT_CLINIC_OR_DEPARTMENT_OTHER): Payer: BC Managed Care – PPO | Admitting: Oncology

## 2012-02-14 VITALS — BP 146/84 | HR 93 | Temp 97.3°F | Resp 18 | Ht 71.5 in | Wt 212.2 lb

## 2012-02-14 DIAGNOSIS — C8582 Other specified types of non-Hodgkin lymphoma, intrathoracic lymph nodes: Secondary | ICD-10-CM

## 2012-02-14 DIAGNOSIS — C8590 Non-Hodgkin lymphoma, unspecified, unspecified site: Secondary | ICD-10-CM

## 2012-02-14 NOTE — Telephone Encounter (Signed)
gv and printed appt schedule for pt for march 2014..the patient aware to come back b4 visit for xray

## 2012-02-14 NOTE — Progress Notes (Signed)
   [facility]    OFFICE PROGRESS NOTE   INTERVAL HISTORY:   He returns as scheduled. Mild malaise. He continues to have intermittent upper respiratory infections. The exertional dyspnea has improved. No fever, night sweats, or anorexia. He is working full-time.  Objective:  Vital signs in last 24 hours:  Blood pressure 146/84, pulse 93, temperature 97.3 F (36.3 C), temperature source Oral, resp. rate 18, height 5' 11.5" (1.816 m), weight 212 lb 3.2 oz (96.253 kg).    HEENT: Neck without mass Lymphatics: No cervical, supraclavicular, axillary, or inguinal nodes Resp: Lungs clear bilaterally with decreased breath sounds at the left base Cardio: Regular rate and rhythm GI: No hepatomegaly Vascular: No leg edema    Medications: I have reviewed the patient's current medications.  Assessment/Plan: 1. Diffuse large B-cell lymphoma involving a large anterior mediastinal mass, CD20 positive. He is status post cycle 1 CHOP/Rituxan chemotherapy 11/13/2010 (Rituxan was given on 11/14/2010). He completed cycle 2 CHOP/Rituxan 12/04/2010. He completed cycle #3 on October 19. He completed cycle #4 on 01/14/2011 -a restaging PET scan on November 27 shows a marketed improvement in the anterior mediastinal mass/hypermetabolic activity with a small remaining mediastinal mass with mildly increased FDG activity. He completed cycle 6 of CHOP/Rituxan on 02/26/2011. -restaging PET scan 03/25/2011 revealed no abnormal hypermetabolism in the neck, chest, abdomen, or pelvis. The prevascular soft tissue mass was smaller, measuring 1.9 x 3.6 cm -he began radiation consolidation to the chest on 04/27/2011, completed 05/24/2011. 2. Cough secondary to pneumonitis and obstruction from the mediastinal/left upper chest mass, resolved..  3. Left upper extremity deep vein thrombosis diagnosed while hospitalized September 2012. Coumadin was discontinued when the Port-A-Cath was removed in April of  2013. 4. Postobstructive pneumonitis, status post Avelox therapy.  5. Fever/sweats secondary to number 1, resolved.  6. Status post placement of a right PICC line. The PICC was removed on 11/25/2010.  7. Status post Port-A-Cath placement 11/30/2010, removed 06/22/2011 8. ,History of tachycardia. An echocardiogram was unremarkable. The tachycardia has improved 9. Upper respiratory infection when he was here on 02/05/2011. He had a persistent cough 02/26/2011. A chest x-ray was negative on 02/26/2011.  10. Upper respiratory infection September 2013, improved following antibiotic therapy,negative chest x-ray 11/18/2011  11. History of left diaphragm paralysis following the diagnosis of non-Hodgkin's lymphoma-clinically improved    Disposition:  Mr. Camper remains in clinical remission from the non-Hodgkin's lymphoma. He will return for an office visit and chest x-ray in 3 months. He will contact us in the interim for new symptoms.   Thornton Papas, MD  02/14/2012  12:40 PM

## 2012-03-29 ENCOUNTER — Emergency Department (HOSPITAL_COMMUNITY)
Admission: EM | Admit: 2012-03-29 | Discharge: 2012-03-29 | Disposition: A | Discharge status: Home / Self Care | Payer: BC Managed Care – PPO | Attending: Emergency Medicine | Admitting: Emergency Medicine

## 2012-03-29 ENCOUNTER — Encounter (HOSPITAL_COMMUNITY): Payer: Self-pay | Admitting: Emergency Medicine

## 2012-03-29 DIAGNOSIS — M79609 Pain in unspecified limb: Secondary | ICD-10-CM | POA: Insufficient documentation

## 2012-03-29 DIAGNOSIS — Y939 Activity, unspecified: Secondary | ICD-10-CM | POA: Insufficient documentation

## 2012-03-29 DIAGNOSIS — C8589 Other specified types of non-Hodgkin lymphoma, extranodal and solid organ sites: Secondary | ICD-10-CM | POA: Insufficient documentation

## 2012-03-29 DIAGNOSIS — S61209A Unspecified open wound of unspecified finger without damage to nail, initial encounter: Secondary | ICD-10-CM | POA: Insufficient documentation

## 2012-03-29 DIAGNOSIS — Z23 Encounter for immunization: Secondary | ICD-10-CM | POA: Insufficient documentation

## 2012-03-29 DIAGNOSIS — M25519 Pain in unspecified shoulder: Secondary | ICD-10-CM | POA: Insufficient documentation

## 2012-03-29 DIAGNOSIS — S61219A Laceration without foreign body of unspecified finger without damage to nail, initial encounter: Secondary | ICD-10-CM

## 2012-03-29 DIAGNOSIS — W268XXA Contact with other sharp object(s), not elsewhere classified, initial encounter: Secondary | ICD-10-CM | POA: Insufficient documentation

## 2012-03-29 DIAGNOSIS — Z79899 Other long term (current) drug therapy: Secondary | ICD-10-CM | POA: Insufficient documentation

## 2012-03-29 DIAGNOSIS — Z86718 Personal history of other venous thrombosis and embolism: Secondary | ICD-10-CM | POA: Insufficient documentation

## 2012-03-29 DIAGNOSIS — Y9229 Other specified public building as the place of occurrence of the external cause: Secondary | ICD-10-CM | POA: Insufficient documentation

## 2012-03-29 DIAGNOSIS — R51 Headache: Secondary | ICD-10-CM | POA: Insufficient documentation

## 2012-03-29 DIAGNOSIS — M542 Cervicalgia: Secondary | ICD-10-CM | POA: Insufficient documentation

## 2012-03-29 DIAGNOSIS — R52 Pain, unspecified: Secondary | ICD-10-CM | POA: Insufficient documentation

## 2012-03-29 DIAGNOSIS — Z8709 Personal history of other diseases of the respiratory system: Secondary | ICD-10-CM | POA: Insufficient documentation

## 2012-03-29 DIAGNOSIS — Z923 Personal history of irradiation: Secondary | ICD-10-CM | POA: Insufficient documentation

## 2012-03-29 DIAGNOSIS — J029 Acute pharyngitis, unspecified: Secondary | ICD-10-CM | POA: Insufficient documentation

## 2012-03-29 MED ORDER — TETANUS-DIPHTH-ACELL PERTUSSIS 5-2.5-18.5 LF-MCG/0.5 IM SUSP
0.5000 mL | Freq: Once | INTRAMUSCULAR | Status: AC
  Administered 2012-03-29: 0.5 mL via INTRAMUSCULAR
  Filled 2012-03-29: qty 0.5

## 2012-03-29 NOTE — ED Notes (Signed)
Discharge instructions given and reviewed with patient.  Patient verbalized understanding to follow up with Dr. Juanetta Gosling as needed.  Patient ambulatory; discharged home in good condition.

## 2012-03-29 NOTE — ED Notes (Signed)
Patient states he cut his finger on Friday and now c/o shoulder, neck and leg aches.  States tried to see Dr. Juanetta Gosling yesterday, but office was busy.  Patient states he may need a tetanus shot.

## 2012-03-29 NOTE — ED Provider Notes (Signed)
History     CSN: [id number]  Arrival date & time 03/29/12  0417   First MD Initiated Contact with Patient 03/29/12 [phone]      Chief Complaint  Patient presents with  . Generalized Body Aches  . Laceration    (Consider location/radiation/quality/duration/timing/severity/associated sxs/prior treatment) HPI Comments: Patient with history of Non-Hodgkins Lymphoma treated with chemo, radiation last year.  Cut his finger on the chair in a restaurant two days ago.  He is now feeling achy and sore.  No fevers or chills.  He has a mild headache and says that his jaw is sore.  He read on the internet that he could possible have tetanus.  Last tdap over 10 years ago.  Patient is a 32 y.o. male presenting with skin laceration. The history is provided by the patient.  Laceration  Incident onset: 2 days ago. Pain location: left hand. The laceration is 1 cm in size. The laceration mechanism is unknown.The pain is at a severity of 0/10. The patient is experiencing no pain.    Past Medical History  Diagnosis Date  . 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 Dyspnea   . Lymphoma   . Cancer 11/06/2010    NHL-Diffuse large B-cell lymphoma  . DVT (deep venous thrombosis) 11/2010    LUE  . History of DVT (deep vein thrombosis) 11/2010    left upper extremity   . Allergy     Past Surgical History  Procedure Date  . Mediastinotomy 11/06/2010    Lt anterior  mediastinal mass, rule out lymphoma  . Insertion of right ij port-a-cath. 11/30/10    [place]  . Portacath placement 11/2010  . Eardrum repair   . Extraction of multiple wisdom teeth   . Port-a-cath removal 06/22/2011    Procedure: REMOVAL PORT-A-CATH;  Surgeon: Ines Bloomer, MD;  Location: [facility] OR;  Service: Thoracic;  Laterality: Right;    Family History  Problem Relation Age of Onset  . Cancer Maternal Grandmother     sinus cavity  . Anesthesia problems Neg Hx     History  Substance Use Topics  . Smoking  status: Never Smoker   . Smokeless tobacco: Never Used  . Alcohol Use: 0.6 oz/week    1 Cans of beer per week     Comment: occasional  5-5 beers in last 4 months      Review of Systems  All other systems reviewed and are negative.    Allergies  Levofloxacin  Home Medications   Current Outpatient Rx  Name  Route  Sig  Dispense  Refill  . GUAIFENESIN ER 600 MG PO TB12   Oral   Take 1,200 mg by mouth 2 (two) times daily as needed.         . IBUPROFEN 200 MG PO CAPS   Oral   Take 400 mg by mouth as needed.         . OXYCODONE-ACETAMINOPHEN 5-325 MG PO TABS   Oral   Take 1-2 tablets by mouth every 4 (four) hours as needed.   30 tablet   0   . RANITIDINE HCL 75 MG PO TABS   Oral   Take 75 mg by mouth at bedtime.            BP 129/84  Pulse 88  Temp 98.1 F (36.7 C) (Oral)  Resp 18  Ht 6' (1.829 m)  Wt 210 lb (95.255 kg)  BMI 28.48 kg/m2  SpO2 96%  Physical Exam  Nursing note reviewed. Constitutional: He is oriented to person, place, and time. He appears well-developed and well-nourished. No distress.  HENT:  Head: Normocephalic and atraumatic.  Mouth/Throat: Oropharynx is clear and moist.       No trismus.  Neck: Normal range of motion. Neck supple.       No nuchal rigidity.  Cardiovascular: Normal rate and regular rhythm.   No murmur heard. Pulmonary/Chest: Effort normal and breath sounds normal. No respiratory distress.  Abdominal: Soft. Bowel sounds are normal.  Musculoskeletal: Normal range of motion. He exhibits no edema.  Lymphadenopathy:    He has no cervical adenopathy.  Neurological: He is alert and oriented to person, place, and time.  Skin: Skin is warm and dry. He is not diaphoretic.    ED Course  Procedures (including critical care time)  Labs Reviewed - No data to display No results found.   No diagnosis found.    MDM  Doubt tetanus.  More likely viral syndrome.  Will give tetanus immunization, return  prn.        Geoffery Lyons, MD 03/29/12 [phone]

## 2012-05-15 ENCOUNTER — Ambulatory Visit (HOSPITAL_BASED_OUTPATIENT_CLINIC_OR_DEPARTMENT_OTHER): Payer: BC Managed Care – PPO | Admitting: Oncology

## 2012-05-15 ENCOUNTER — Ambulatory Visit (HOSPITAL_COMMUNITY)
Admission: RE | Admit: 2012-05-15 | Discharge: 2012-05-15 | Disposition: A | Discharge status: Home / Self Care | Payer: BC Managed Care – PPO | Source: Ambulatory Visit | Attending: Oncology | Admitting: Oncology

## 2012-05-15 ENCOUNTER — Telehealth: Payer: Self-pay | Admitting: Oncology

## 2012-05-15 VITALS — BP 140/85 | HR 104 | Temp 97.3°F | Resp 20 | Ht 72.0 in | Wt 212.8 lb

## 2012-05-15 DIAGNOSIS — C8590 Non-Hodgkin lymphoma, unspecified, unspecified site: Secondary | ICD-10-CM

## 2012-05-15 DIAGNOSIS — C8589 Other specified types of non-Hodgkin lymphoma, extranodal and solid organ sites: Secondary | ICD-10-CM | POA: Insufficient documentation

## 2012-05-15 NOTE — Telephone Encounter (Signed)
gv and printed appt schedule for pt for Sept °

## 2012-05-15 NOTE — Progress Notes (Signed)
   [facility]    OFFICE PROGRESS NOTE   INTERVAL HISTORY:   He returns as scheduled. No cough or dyspnea. Good appetite and energy level. He is working. No complaint.  Objective:  Vital signs in last 24 hours:  Blood pressure 140/85, pulse 104, temperature 97.3 F (36.3 C), temperature source Oral, resp. rate 20, height 6' (1.829 m), weight 212 lb 12.8 oz (96.525 kg). manual pulse-84    HEENT: Neck without mass Lymphatics: No cervical, superventricular, axillary, or inguinal nodes Resp: Lungs clear bilaterally with good air movement Cardio: Regular rate and rhythm GI: No hepatosplenomegaly Vascular: No leg edema  X-rays: Chest x-ray in 05/15/2012 improved lung volumes, no airspace disease or effusion.  Medications: I have reviewed the patient's current medications.  Assessment/Plan: 1. Diffuse large B-cell lymphoma involving a large anterior mediastinal mass, CD20 positive. He is status post cycle 1 CHOP/Rituxan chemotherapy 11/13/2010 (Rituxan was given on 11/14/2010). He completed cycle 2 CHOP/Rituxan 12/04/2010. He completed cycle #3 on October 19. He completed cycle #4 on 01/14/2011 -a restaging PET scan on November 27 shows a marketed improvement in the anterior mediastinal mass/hypermetabolic activity with a small remaining mediastinal mass with mildly increased FDG activity. He completed cycle 6 of CHOP/Rituxan on 02/26/2011. -restaging PET scan 03/25/2011 revealed no abnormal hypermetabolism in the neck, chest, abdomen, or pelvis. The prevascular soft tissue mass was smaller, measuring 1.9 x 3.6 cm -he began radiation consolidation to the chest on 04/27/2011, completed 05/24/2011. 2. Cough secondary to pneumonitis and obstruction from the mediastinal/left upper chest mass, resolved..  3. Left upper extremity deep vein thrombosis diagnosed while hospitalized September 2012. Coumadin was discontinued when the Port-A-Cath was removed in April of  2013. 4. Postobstructive pneumonitis, status post Avelox therapy.  5. Fever/sweats secondary to number 1, resolved.  6. Status post placement of a right PICC line. The PICC was removed on 11/25/2010.  7. Status post Port-A-Cath placement 11/30/2010, removed 06/22/2011 8. History of tachycardia. An echocardiogram was unremarkable. The tachycardia has improved 9. Upper respiratory infection when he was here on 02/05/2011. He had a persistent cough 02/26/2011. A chest x-ray was negative on 02/26/2011.  10. Upper respiratory infection September 2013, improved following antibiotic therapy,negative chest x-ray 11/18/2011  11. History of left diaphragm paralysis following the diagnosis of non-Hodgkin's lymphoma-improved  Disposition:  He remains in clinical remission from non-Hodgkin's lymphoma. Mr. Crewe will return for an office visit in 6 months. He will contact us in the interim for new symptoms.   Thornton Papas, MD  05/15/2012  6:22 PM

## 2012-10-28 ENCOUNTER — Emergency Department (HOSPITAL_COMMUNITY)
Admission: EM | Admit: 2012-10-28 | Discharge: 2012-10-28 | Disposition: A | Discharge status: Home / Self Care | Payer: BC Managed Care – PPO | Attending: Emergency Medicine | Admitting: Emergency Medicine

## 2012-10-28 ENCOUNTER — Encounter (HOSPITAL_COMMUNITY): Payer: Self-pay | Admitting: *Deleted

## 2012-10-28 DIAGNOSIS — L03115 Cellulitis of right lower limb: Secondary | ICD-10-CM

## 2012-10-28 DIAGNOSIS — Z87898 Personal history of other specified conditions: Secondary | ICD-10-CM | POA: Insufficient documentation

## 2012-10-28 DIAGNOSIS — Y929 Unspecified place or not applicable: Secondary | ICD-10-CM | POA: Insufficient documentation

## 2012-10-28 DIAGNOSIS — Y939 Activity, unspecified: Secondary | ICD-10-CM | POA: Insufficient documentation

## 2012-10-28 DIAGNOSIS — L02619 Cutaneous abscess of unspecified foot: Secondary | ICD-10-CM | POA: Insufficient documentation

## 2012-10-28 DIAGNOSIS — T6391XA Toxic effect of contact with unspecified venomous animal, accidental (unintentional), initial encounter: Secondary | ICD-10-CM | POA: Insufficient documentation

## 2012-10-28 DIAGNOSIS — Z86718 Personal history of other venous thrombosis and embolism: Secondary | ICD-10-CM | POA: Insufficient documentation

## 2012-10-28 MED ORDER — SULFAMETHOXAZOLE-TRIMETHOPRIM 800-160 MG PO TABS: 1.0000 | ORAL_TABLET | Freq: Two times a day (BID) | ORAL | Status: DC

## 2012-10-28 MED ORDER — CEPHALEXIN 500 MG PO CAPS
1000.0000 mg | ORAL_CAPSULE | Freq: Once | ORAL | Status: AC
  Administered 2012-10-28: 1000 mg via ORAL
  Filled 2012-10-28: qty 2

## 2012-10-28 MED ORDER — CEPHALEXIN 500 MG PO CAPS: ORAL_CAPSULE | ORAL | Status: DC

## 2012-10-28 NOTE — ED Notes (Signed)
Pt was stung on right foot by jelly fish on Thursday morning and c/o swelling and pain.

## 2012-10-28 NOTE — ED Provider Notes (Signed)
CSN: [id number]     Arrival date & time 10/28/12  2213 History     First MD Initiated Contact with Patient 10/28/12 2301     Chief Complaint  Patient presents with  . jellyfish sting    (Consider location/radiation/quality/duration/timing/severity/associated sxs/prior Treatment) HPI This 32 year old male is in remission from prior lymphoma, his tetanus shot is up-to-date within the last year, he was stung on the dorsum of his right foot 2 days ago by a jellyfish, he is a localized redness and pain gradually worsening with worsening redness surrounding the jellyfish sting area with increased redness and increased warmth over the last few days, he is no asymmetry no red streaks up his leg no weakness or numbness to the foot no systemic symptoms with no fever chest pain shortness breath abdominal pain vomiting or other concerns. His pain is controlled with ibuprofen. He is concerned about possible infection due to the increasing redness and swelling to the dorsum of his foot. Past Medical History  Diagnosis Date  . 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 Dyspnea   . Lymphoma   . Cancer 11/06/2010    NHL-Diffuse large B-cell lymphoma  . DVT (deep venous thrombosis) 11/2010    LUE  . History of DVT (deep vein thrombosis) 11/2010    left upper extremity   . Allergy    Past Surgical History  Procedure Laterality Date  . Mediastinotomy  11/06/2010    Lt anterior  mediastinal mass, rule out lymphoma  . Insertion of right ij port-a-cath.  11/30/10    [place]  . Portacath placement  11/2010  . Eardrum repair    . Extraction of multiple wisdom teeth    . Port-a-cath removal  06/22/2011    Procedure: REMOVAL PORT-A-CATH;  Surgeon: Ines Bloomer, MD;  Location: [facility] OR;  Service: Thoracic;  Laterality: Right;   Family History  Problem Relation Age of Onset  . Cancer Maternal Grandmother     sinus cavity  . Anesthesia problems Neg Hx    History  Substance Use Topics  .  Smoking status: Never Smoker   . Smokeless tobacco: Never Used  . Alcohol Use: 0.6 oz/week    1 Cans of beer per week     Comment: occasional  5-5 beers in last 4 months    Review of Systems 10 Systems reviewed and are negative for acute change except as noted in the HPI. Allergies  Levofloxacin  Home Medications   Current Outpatient Rx  Name  Route  Sig  Dispense  Refill  . cephALEXin (KEFLEX) 500 MG capsule      2 caps po bid x 7 days   28 capsule   0   . guaiFENesin (MUCINEX) 600 MG 12 hr tablet   Oral   Take 1,200 mg by mouth 2 (two) times daily as needed.         . Ibuprofen (ADVIL) 200 MG CAPS   Oral   Take 400 mg by mouth as needed.         . ranitidine (ZANTAC) 75 MG tablet   Oral   Take 75 mg by mouth at bedtime.          . sulfamethoxazole-trimethoprim (BACTRIM DS,SEPTRA DS) 800-160 MG per tablet   Oral   Take 1 tablet by mouth 2 (two) times daily. One po bid x 7 days   14 tablet   0    BP 139/84  Pulse  100  Temp(Src) 98.6 F (37 C) (Oral)  Resp 20  Ht 6\' 1"  (1.854 m)  Wt 210 lb (95.255 kg)  BMI 27.71 kg/m2  SpO2 99% Physical Exam  Nursing note and vitals reviewed. Constitutional:  Awake, alert, nontoxic appearance.  HENT:  Head: Atraumatic.  Eyes: Right eye exhibits no discharge. Left eye exhibits no discharge.  Neck: Neck supple.  Cardiovascular: Normal rate and regular rhythm.   No murmur heard. Pulmonary/Chest: Effort normal and breath sounds normal. No respiratory distress. He has no wheezes. He has no rales. He exhibits no tenderness.  Abdominal: Soft. Bowel sounds are normal. He exhibits no distension. There is no tenderness. There is no rebound and no guarding.  Musculoskeletal: He exhibits tenderness.  Baseline ROM, no obvious new focal weakness. Both arms and left leg nontender. Right leg is nontender at the thigh and calf right foot has capillary refill less than 2 seconds normal light touch dorsalis pedis pulse intact good  movement with the right dorsal medial midfoot region with localized erythema induration swelling without fluctuance or purulence drainage or purpura surrounding his jellyfish sting region several centimeters in diameter cannot rule out possible localized cellulitis versus prolonged and worsening localized reaction to jellyfish sting  Neurological:  Mental status and motor strength appears baseline for patient and situation.  Skin: No rash noted.  Psychiatric: He has a normal mood and affect.    ED Course   Procedures (including critical care time)  Labs Reviewed - No data to display No results found. 1. Cellulitis of right foot   2. Jellyfish sting, initial encounter     MDM  I doubt any other EMC precluding discharge at this time including, but not necessarily limited to the following:sepsis, nec fasc.  Hurman Horn, MD 10/29/12 [phone]

## 2012-11-16 ENCOUNTER — Telehealth: Payer: Self-pay | Admitting: Oncology

## 2012-11-16 ENCOUNTER — Ambulatory Visit (HOSPITAL_BASED_OUTPATIENT_CLINIC_OR_DEPARTMENT_OTHER): Payer: BC Managed Care – PPO | Admitting: Oncology

## 2012-11-16 VITALS — BP 120/81 | HR 81 | Temp 97.7°F | Resp 18 | Ht 73.0 in | Wt 211.9 lb

## 2012-11-16 DIAGNOSIS — C8582 Other specified types of non-Hodgkin lymphoma, intrathoracic lymph nodes: Secondary | ICD-10-CM

## 2012-11-16 DIAGNOSIS — C8590 Non-Hodgkin lymphoma, unspecified, unspecified site: Secondary | ICD-10-CM

## 2012-11-16 DIAGNOSIS — Z23 Encounter for immunization: Secondary | ICD-10-CM

## 2012-11-16 MED ORDER — INFLUENZA VAC SPLIT QUAD 0.5 ML IM SUSP
0.5000 mL | Freq: Once | INTRAMUSCULAR | Status: AC
  Administered 2012-11-16: 0.5 mL via INTRAMUSCULAR
  Filled 2012-11-16: qty 0.5

## 2012-11-16 NOTE — Telephone Encounter (Signed)
Gave pt appt with MD on March 2015

## 2012-11-16 NOTE — Progress Notes (Signed)
   [name]    OFFICE PROGRESS NOTE   INTERVAL HISTORY:   He returns as scheduled. He feels well. Good appetite and energy level. No fever or sweats. No dyspnea. No palpable lymph nodes. He recently suffered a "jelly fish "bite at the [place] while [place] the [place]. The foot and lower leg became swollen. He was treated with antibiotics and the symptoms resolved. He has returned to work.  Objective:  Vital signs in last 24 hours:  Blood pressure 120/81, pulse 81, temperature 97.7 F (36.5 C), temperature source Oral, resp. rate 18, height 6\' 1"  (1.854 m), weight 211 lb 14.4 oz (96.117 kg).    HEENT: Neck without mass Lymphatics: No cervical, supra-clavicular, axillary, or inguinal nodes Resp: Lungs clear bilaterally with good air movement over the left lower chest, no respiratory distress Cardio: Regular rate and rhythm GI: No hepatosplenomegaly, no mass, nontender Vascular: Trace edema at the right lower leg and ankle, no erythema , the left upper arm is slightly larger than the right side without lower arm or hand edema  Skin: Small eschar at the dorsum of the right foot   Medications: I have reviewed the patient's current medications.  Assessment/Plan: 1. Diffuse large B-cell lymphoma involving a large anterior mediastinal mass, CD20 positive. He is status post cycle 1 CHOP/Rituxan chemotherapy 11/13/2010 (Rituxan was given on 11/14/2010). He completed cycle 2 CHOP/Rituxan 12/04/2010. He completed cycle #3 on October 19. He completed cycle #4 on 01/14/2011 -a restaging PET scan on November 27 shows a marketed improvement in the anterior mediastinal mass/hypermetabolic activity with a small remaining mediastinal mass with mildly increased FDG activity. He completed cycle 6 of CHOP/Rituxan on 02/26/2011. -restaging PET scan 03/25/2011 revealed no abnormal hypermetabolism in the neck, chest, abdomen, or pelvis. The prevascular soft tissue mass was smaller, measuring  1.9 x 3.6 cm -he began radiation consolidation to the chest on 04/27/2011, completed 05/24/2011. 2. Cough secondary to pneumonitis and obstruction from the mediastinal/left upper chest mass, resolved..  3. Left upper extremity deep vein thrombosis diagnosed while hospitalized September 2012. Coumadin was discontinued when the Port-A-Cath was removed in April of 2013. 4. Postobstructive pneumonitis, status post Avelox therapy.  5. Fever/sweats secondary to number 1, resolved.  6. Status post placement of a right PICC line. The PICC was removed on 11/25/2010.  7. Status post Port-A-Cath placement 11/30/2010, removed 06/22/2011 8. History of tachycardia. An echocardiogram was unremarkable. The tachycardia has improved 9. Upper respiratory infection when he was here on 02/05/2011. He had a persistent cough 02/26/2011. A chest x-ray was negative on 02/26/2011.  10. Upper respiratory infection September 2013, improved following antibiotic therapy,negative chest x-ray 11/18/2011  11. History of left diaphragm paralysis following the diagnosis of non-Hodgkin's lymphoma-improved    Disposition:  Mr. College remains in clinical remission from non-Hodgkin's lymphoma. He will return for an office visit in 6 months. He received an influenza vaccine today.   Thornton Papas, MD  11/16/2012  6:11 PM

## 2013-02-21 ENCOUNTER — Other Ambulatory Visit (HOSPITAL_COMMUNITY): Payer: Self-pay | Admitting: Pulmonary Disease

## 2013-02-21 ENCOUNTER — Ambulatory Visit (HOSPITAL_COMMUNITY)
Admission: RE | Admit: 2013-02-21 | Discharge: 2013-02-21 | Disposition: A | Discharge status: Home / Self Care | Payer: BC Managed Care – PPO | Source: Ambulatory Visit | Attending: Pulmonary Disease | Admitting: Pulmonary Disease

## 2013-02-21 DIAGNOSIS — R059 Cough, unspecified: Secondary | ICD-10-CM

## 2013-02-21 DIAGNOSIS — R05 Cough: Secondary | ICD-10-CM

## 2013-02-21 DIAGNOSIS — Z87898 Personal history of other specified conditions: Secondary | ICD-10-CM | POA: Insufficient documentation

## 2013-04-06 ENCOUNTER — Telehealth: Payer: Self-pay | Admitting: Oncology

## 2013-04-06 NOTE — Telephone Encounter (Signed)
Gave pt appt for md visit on March 2015

## 2013-05-29 ENCOUNTER — Telehealth: Payer: Self-pay | Admitting: Obstetrics and Gynecology

## 2013-05-29 ENCOUNTER — Ambulatory Visit (HOSPITAL_BASED_OUTPATIENT_CLINIC_OR_DEPARTMENT_OTHER): Payer: BC Managed Care – PPO | Admitting: Oncology

## 2013-05-29 VITALS — BP 124/75 | HR 90 | Temp 98.1°F | Resp 19 | Ht 73.0 in | Wt 209.6 lb

## 2013-05-29 DIAGNOSIS — C8590 Non-Hodgkin lymphoma, unspecified, unspecified site: Secondary | ICD-10-CM

## 2013-05-29 DIAGNOSIS — R000 Tachycardia, unspecified: Secondary | ICD-10-CM

## 2013-05-29 DIAGNOSIS — C8589 Other specified types of non-Hodgkin lymphoma, extranodal and solid organ sites: Secondary | ICD-10-CM

## 2013-05-29 NOTE — Telephone Encounter (Signed)
per pof to sch an appt for pt for 19mths/sch for 9/24 @9 :45-gave pt [place]

## 2013-05-29 NOTE — Progress Notes (Signed)
   [place]    OFFICE PROGRESS NOTE   INTERVAL HISTORY:   [name] returns for scheduled followup of non-Hodgkin's lymphoma. He feels well. He is working. No fever or dyspnea. Good appetite. He reports having influenza in December of 2014. A chest x-ray 02/21/2013 revealed no acute disease.  Objective:  Vital signs in last 24 hours:  Blood pressure 124/75, pulse 90, temperature 98.1 F (36.7 C), temperature source Oral, resp. rate 19, height 6\' 1"  (1.854 m), weight 209 lb 9.6 oz (95.074 kg).    HEENT: Neck without mass Lymphatics: No cervical, supraclavicular, axillary, or inguinal nodes Resp: Lungs clear bilaterally Cardio: Regular rate and rhythm GI: No hepatosplenomegaly Vascular: No leg edema    Medications: I have reviewed the patient's current medications.  Assessment/Plan: 1. Diffuse large B-cell lymphoma involving a large anterior mediastinal mass, CD20 positive. He is status post cycle 1 CHOP/Rituxan chemotherapy 11/13/2010 (Rituxan was given on 11/14/2010). He completed cycle 2 CHOP/Rituxan 12/04/2010. He completed cycle #3 on October 19. He completed cycle #4 on 01/14/2011 -a restaging PET scan on November 27 shows a marketed improvement in the anterior mediastinal mass/hypermetabolic activity with a small remaining mediastinal mass with mildly increased FDG activity. He completed cycle 6 of CHOP/Rituxan on 02/26/2011. -restaging PET scan 03/25/2011 revealed no abnormal hypermetabolism in the neck, chest, abdomen, or pelvis. The prevascular soft tissue mass was smaller, measuring 1.9 x 3.6 cm -he began radiation consolidation to the chest on 04/27/2011, completed 05/24/2011. 2. Cough secondary to pneumonitis and obstruction from the mediastinal/left upper chest mass, resolved..  3. Left upper extremity deep vein thrombosis diagnosed while hospitalized September 2012. Coumadin was discontinued when the Port-A-Cath was removed in April of  2013. 4. Postobstructive pneumonitis, status post Avelox therapy.  5. Fever/sweats secondary to number 1, resolved.  6. Status post placement of a right PICC line. The PICC was removed on 11/25/2010.  7. Status post Port-A-Cath placement 11/30/2010, removed 06/22/2011 8. History of tachycardia. An echocardiogram was unremarkable. The tachycardia has improved 9. Upper respiratory infection when he was here on 02/05/2011. He had a persistent cough 02/26/2011. A chest x-ray was negative on 02/26/2011.  10. Upper respiratory infection September 2013, improved following antibiotic therapy,negative chest x-ray 11/18/2011  11. History of left diaphragm paralysis following the diagnosis of non-Hodgkin's lymphoma-improved    Disposition:  He remains in clinical remission from non-Hodgkin's lymphoma. [name] will return for an office visit in 6 months. He will contact us in the interim for new symptoms.   Betsy Coder, MD  05/29/2013  4:03 PM

## 2013-11-13 ENCOUNTER — Other Ambulatory Visit (HOSPITAL_COMMUNITY): Payer: Self-pay | Admitting: Pulmonary Disease

## 2013-11-13 ENCOUNTER — Ambulatory Visit (HOSPITAL_COMMUNITY)
Admission: RE | Admit: 2013-11-13 | Discharge: 2013-11-13 | Disposition: A | Discharge status: Home / Self Care | Payer: BC Managed Care – PPO | Source: Ambulatory Visit | Attending: Pulmonary Disease | Admitting: Pulmonary Disease

## 2013-11-13 DIAGNOSIS — R059 Cough, unspecified: Secondary | ICD-10-CM | POA: Diagnosis not present

## 2013-11-13 DIAGNOSIS — R05 Cough: Secondary | ICD-10-CM | POA: Diagnosis not present

## 2013-11-29 ENCOUNTER — Ambulatory Visit (HOSPITAL_BASED_OUTPATIENT_CLINIC_OR_DEPARTMENT_OTHER): Payer: BC Managed Care – PPO | Admitting: Oncology

## 2013-11-29 ENCOUNTER — Telehealth: Payer: Self-pay | Admitting: Oncology

## 2013-11-29 VITALS — BP 108/82 | HR 82 | Temp 97.9°F | Resp 20 | Ht 73.0 in | Wt 211.8 lb

## 2013-11-29 DIAGNOSIS — C8589 Other specified types of non-Hodgkin lymphoma, extranodal and solid organ sites: Secondary | ICD-10-CM

## 2013-11-29 DIAGNOSIS — Z23 Encounter for immunization: Secondary | ICD-10-CM

## 2013-11-29 DIAGNOSIS — C8590 Non-Hodgkin lymphoma, unspecified, unspecified site: Secondary | ICD-10-CM

## 2013-11-29 MED ORDER — PNEUMOCOCCAL 13-VAL CONJ VACC IM SUSP
0.5000 mL | INTRAMUSCULAR | Status: AC
  Administered 2013-11-29: 0.5 mL via INTRAMUSCULAR
  Filled 2013-11-29: qty 0.5

## 2013-11-29 MED ORDER — INFLUENZA VAC SPLIT QUAD 0.5 ML IM SUSY
0.5000 mL | PREFILLED_SYRINGE | Freq: Once | INTRAMUSCULAR | Status: AC
  Administered 2013-11-29: 0.5 mL via INTRAMUSCULAR
  Filled 2013-11-29: qty 0.5

## 2013-11-29 NOTE — Patient Instructions (Signed)
Please provide copy of your Advanced Directive information at you next office visit to be scanned into your record.

## 2013-11-29 NOTE — Progress Notes (Signed)
  [facility] OFFICE PROGRESS NOTE   Diagnosis: Non-Hodgkin's lymphoma  INTERVAL HISTORY:   He returns as scheduled. He feels well. No palpable lymph nodes. No dyspnea. No consistent cough. A few weeks ago he was diagnosed with "bronchitis 20 and a cough and low-grade fever. Multiple family members had bronchitis.  Objective:  Vital signs in last 24 hours:  Blood pressure 118/91, pulse 82, temperature 97.9 F (36.6 C), temperature source Oral, resp. rate 20, height 6\' 1"  (1.854 m), weight 211 lb 12.8 oz (96.072 kg).    HEENT: Neck without mass Lymphatics: No cervical, supraclavicular, axillary, or inguinal nodes Resp: Lungs clear bilaterally Cardio: Regular rate and rhythm GI: No hepatosplenomegaly, nontender Vascular: No leg edema  Imaging: Chest x-ray 11/13/2013 -- no active cardiopulmonary disease Medications: I have reviewed the patient's current medications.  Assessment/Plan: 1. Diffuse large B-cell lymphoma involving a large anterior mediastinal mass, CD20 positive. He is status post cycle 1 CHOP/Rituxan chemotherapy 11/13/2010 (Rituxan was given on 11/14/2010). He completed cycle 2 CHOP/Rituxan 12/04/2010. He completed cycle #3 on October 19. He completed cycle #4 on 01/14/2011 -a restaging PET scan on November 27 shows a marketed improvement in the anterior mediastinal mass/hypermetabolic activity with a small remaining mediastinal mass with mildly increased FDG activity. He completed cycle 6 of CHOP/Rituxan on 02/26/2011. -restaging PET scan 03/25/2011 revealed no abnormal hypermetabolism in the neck, chest, abdomen, or pelvis. The prevascular soft tissue mass was smaller, measuring 1.9 x 3.6 cm -he began radiation consolidation to the chest on 04/27/2011, completed 05/24/2011. 2. Cough secondary to pneumonitis and obstruction from the mediastinal/left upper chest mass, resolved..  3. Left upper extremity deep vein thrombosis diagnosed while hospitalized  September 2012. Coumadin was discontinued when the Port-A-Cath was removed in April of 2013. 4. Postobstructive pneumonitis, status post Avelox therapy.  5. Fever/sweats secondary to number 1, resolved.  6. Status post placement of a right PICC line. The PICC was removed on 11/25/2010.  7. Status post Port-A-Cath placement 11/30/2010, removed 06/22/2011 8. History of tachycardia. An echocardiogram was unremarkable. The tachycardia has improved 9. Upper respiratory infection when he was here on 02/05/2011. He had a persistent cough 02/26/2011. A chest x-ray was negative on 02/26/2011.  10. Upper respiratory infection September 2013, improved following antibiotic therapy,negative chest x-ray 11/18/2011  11. History of left diaphragm paralysis following the diagnosis of non-Hodgkin's lymphoma-improved    Disposition:  He remains in clinical remission from non-Hodgkin's lymphoma. He will receive an influenza vaccine and Prevnar vaccine today. We will be sure he is up-to-date on the pneumococcal vaccine.  Mr. Scorsone will return for an office visit in 6 months.  Betsy Coder, MD  11/29/2013  11:25 AM

## 2013-11-29 NOTE — Telephone Encounter (Signed)
Pt confirmed MD visit per 09/24 POF, gave pt AVS.......KJ

## 2014-01-11 ENCOUNTER — Other Ambulatory Visit (HOSPITAL_COMMUNITY): Payer: Self-pay | Admitting: Pulmonary Disease

## 2014-01-11 DIAGNOSIS — C8580 Other specified types of non-Hodgkin lymphoma, unspecified site: Secondary | ICD-10-CM

## 2014-01-17 ENCOUNTER — Ambulatory Visit (HOSPITAL_COMMUNITY)
Admission: RE | Admit: 2014-01-17 | Discharge: 2014-01-17 | Disposition: A | Discharge status: Home / Self Care | Payer: BC Managed Care – PPO | Source: Ambulatory Visit | Attending: Pulmonary Disease | Admitting: Pulmonary Disease

## 2014-01-17 DIAGNOSIS — R10819 Abdominal tenderness, unspecified site: Secondary | ICD-10-CM | POA: Insufficient documentation

## 2014-01-17 DIAGNOSIS — K760 Fatty (change of) liver, not elsewhere classified: Secondary | ICD-10-CM | POA: Insufficient documentation

## 2014-01-17 DIAGNOSIS — C8580 Other specified types of non-Hodgkin lymphoma, unspecified site: Secondary | ICD-10-CM

## 2014-01-17 DIAGNOSIS — Z8572 Personal history of non-Hodgkin lymphomas: Secondary | ICD-10-CM | POA: Diagnosis not present

## 2014-01-17 MED ORDER — IOHEXOL 300 MG/ML  SOLN
100.0000 mL | Freq: Once | INTRAMUSCULAR | Status: AC | PRN
  Administered 2014-01-17: 100 mL via INTRAVENOUS

## 2014-02-23 ENCOUNTER — Other Ambulatory Visit: Payer: Self-pay | Admitting: Nurse Practitioner

## 2014-05-30 ENCOUNTER — Telehealth: Payer: Self-pay | Admitting: Oncology

## 2014-05-30 ENCOUNTER — Ambulatory Visit: Payer: BC Managed Care – PPO | Admitting: Oncology

## 2014-05-30 NOTE — Telephone Encounter (Signed)
Pt called overslept r/s MD visit confirmed D/T .Marland Kitchen... KJ

## 2014-06-04 ENCOUNTER — Ambulatory Visit (HOSPITAL_BASED_OUTPATIENT_CLINIC_OR_DEPARTMENT_OTHER): Payer: BLUE CROSS/BLUE SHIELD | Admitting: Oncology

## 2014-06-04 ENCOUNTER — Telehealth: Payer: Self-pay | Admitting: Oncology

## 2014-06-04 VITALS — BP 114/70 | HR 71 | Temp 98.1°F | Resp 19 | Ht 73.0 in | Wt 211.7 lb

## 2014-06-04 DIAGNOSIS — Z8572 Personal history of non-Hodgkin lymphomas: Secondary | ICD-10-CM

## 2014-06-04 DIAGNOSIS — Z23 Encounter for immunization: Secondary | ICD-10-CM | POA: Diagnosis not present

## 2014-06-04 DIAGNOSIS — C8590 Non-Hodgkin lymphoma, unspecified, unspecified site: Secondary | ICD-10-CM

## 2014-06-04 DIAGNOSIS — Z299 Encounter for prophylactic measures, unspecified: Secondary | ICD-10-CM

## 2014-06-04 MED ORDER — PNEUMOCOCCAL VAC POLYVALENT 25 MCG/0.5ML IJ INJ: 0.5000 mL | INJECTION | Freq: Once | INTRAMUSCULAR | Status: DC

## 2014-06-04 MED ORDER — PNEUMOCOCCAL VAC POLYVALENT 25 MCG/0.5ML IJ INJ
0.5000 mL | INJECTION | Freq: Once | INTRAMUSCULAR | Status: AC
  Administered 2014-06-04: 0.5 mL via INTRAMUSCULAR
  Filled 2014-06-04: qty 0.5

## 2014-06-04 NOTE — Telephone Encounter (Signed)
Gave avs & calendar for September °

## 2014-06-04 NOTE — Progress Notes (Signed)
  [name]   Diagnosis: Hodgkin's Lymphoma  INTERVAL HISTORY:   [name] returns for scheduled visit. He feels well. No shortness of breath. He is working. He has developed a "ganglion cyst "at the right wrist.  Objective:  Vital signs in last 24 hours:  Blood pressure 114/70, pulse 71, temperature 98.1 F (36.7 C), temperature source Oral, resp. rate 19, height 6\' 1"  (1.854 m), weight 211 lb 11.2 oz (96.026 kg), SpO2 98 %.    HEENT: Neck without mass Lymphatics: No cervical, supraclavicular, axillary, or inguinal nodes Resp: Lungs clear bilaterally with decreased breath sounds at the left base, no respiratory distress Cardio: Regular rate and rhythm GI: No hepatosplenomegaly, nontender, no mass Vascular: No leg edema, no left arm edema Muscle skeletal: 1 cm mobile cystic lesion at the right wrist    Medications: I have reviewed the patient's current medications.  Assessment/Plan: 1. Diffuse large B-cell lymphoma involving a large anterior mediastinal mass, CD20 positive. He is status post cycle 1 CHOP/Rituxan chemotherapy 11/13/2010 (Rituxan was given on 11/14/2010). He completed cycle 2 CHOP/Rituxan 12/04/2010. He completed cycle #3 on October 19. He completed cycle #4 on 01/14/2011 -a restaging PET scan on November 27 shows a marketed improvement in the anterior mediastinal mass/hypermetabolic activity with a small remaining mediastinal mass with mildly increased FDG activity. He completed cycle 6 of CHOP/Rituxan on 02/26/2011. -restaging PET scan 03/25/2011 revealed no abnormal hypermetabolism in the neck, chest, abdomen, or pelvis. The prevascular soft tissue mass was smaller, measuring 1.9 x 3.6 cm -he began radiation consolidation to the chest on 04/27/2011, completed 05/24/2011. 2. Cough secondary to pneumonitis and obstruction from the mediastinal/left upper chest mass, resolved..  3. Left upper extremity deep vein thrombosis diagnosed  while hospitalized September 2012. Coumadin was discontinued when the Port-A-Cath was removed in April of 2013. 4. Postobstructive pneumonitis, status post Avelox therapy.  5. Fever/sweats secondary to number 1, resolved.  6. Status post placement of a right PICC line. The PICC was removed on 11/25/2010.  7. Status post Port-A-Cath placement 11/30/2010, removed 06/22/2011 8. History of tachycardia. An echocardiogram was unremarkable. The tachycardia has improved 9. Upper respiratory infection when he was here on 02/05/2011. He had a persistent cough 02/26/2011. A chest x-ray was negative on 02/26/2011.  10. Upper respiratory infection September 2013, improved following antibiotic therapy,negative chest x-ray 11/18/2011  11. History of left diaphragm paralysis following the diagnosis of non-Hodgkin's lymphoma-improved     Disposition:  [name] remains in clinical remission from non-Hodgkin's lymphoma. He received a 23 valent pneumococcal vaccine today. He will return for an office visit in 6 months. He plans to see Dr. Luan Pulling if the right wrist cyst persists.  Betsy Coder, MD  06/04/2014  2:07 PM

## 2014-06-06 ENCOUNTER — Ambulatory Visit (HOSPITAL_COMMUNITY)
Admission: RE | Admit: 2014-06-06 | Discharge: 2014-06-06 | Disposition: A | Discharge status: Home / Self Care | Payer: BLUE CROSS/BLUE SHIELD | Source: Ambulatory Visit | Attending: Pulmonary Disease | Admitting: Pulmonary Disease

## 2014-06-06 ENCOUNTER — Other Ambulatory Visit (HOSPITAL_COMMUNITY): Payer: Self-pay | Admitting: Pulmonary Disease

## 2014-06-06 DIAGNOSIS — Z86718 Personal history of other venous thrombosis and embolism: Secondary | ICD-10-CM | POA: Diagnosis not present

## 2014-06-06 DIAGNOSIS — R509 Fever, unspecified: Secondary | ICD-10-CM

## 2014-12-02 ENCOUNTER — Other Ambulatory Visit: Payer: Self-pay | Admitting: Orthopedic Surgery

## 2014-12-03 ENCOUNTER — Ambulatory Visit (HOSPITAL_BASED_OUTPATIENT_CLINIC_OR_DEPARTMENT_OTHER): Payer: BLUE CROSS/BLUE SHIELD | Admitting: Oncology

## 2014-12-03 ENCOUNTER — Telehealth: Payer: Self-pay | Admitting: Oncology

## 2014-12-03 VITALS — BP 130/78 | HR 72 | Temp 98.5°F | Resp 18 | Ht 73.0 in | Wt 212.5 lb

## 2014-12-03 DIAGNOSIS — Z8572 Personal history of non-Hodgkin lymphomas: Secondary | ICD-10-CM

## 2014-12-03 DIAGNOSIS — C8590 Non-Hodgkin lymphoma, unspecified, unspecified site: Secondary | ICD-10-CM

## 2014-12-03 DIAGNOSIS — Z86718 Personal history of other venous thrombosis and embolism: Secondary | ICD-10-CM | POA: Diagnosis not present

## 2014-12-03 NOTE — Progress Notes (Signed)
  [name]   Diagnosis: Non-Hodgkin's lymphoma  INTERVAL HISTORY:   [name] returns as scheduled. He had a "cyst "removed from the right wrist yesterday. He otherwise feels well. No dyspnea or fever. Good appetite and energy level.  Objective:  Vital signs in last 24 hours:  Blood pressure 130/78, pulse 72, temperature 98.5 F (36.9 C), temperature source Oral, resp. rate 18, height 6\' 1"  (1.854 m), weight 212 lb 8 oz (96.389 kg), SpO2 100 %.    HEENT: Neck without mass Lymphatics: No cervical, supraclavicular, axillary, or inguinal nodes Resp: Lungs clear bilaterally Cardio: Regular rate and rhythm GI: No hepatosplenomegaly Vascular: No leg edema Musculoskeletal: Bandage/Ace wrap in place at the right arm and hand      Medications: I have reviewed the patient's current medications.  Assessment/Plan: 1. Diffuse large B-cell lymphoma involving a large anterior mediastinal mass, CD20 positive. He is status post cycle 1 CHOP/Rituxan chemotherapy 11/13/2010 (Rituxan was given on 11/14/2010). He completed cycle 2 CHOP/Rituxan 12/04/2010. He completed cycle #3 on October 19. He completed cycle #4 on 01/14/2011 -a restaging PET scan on November 27 shows a marketed improvement in the anterior mediastinal mass/hypermetabolic activity with a small remaining mediastinal mass with mildly increased FDG activity. He completed cycle 6 of CHOP/Rituxan on 02/26/2011. -restaging PET scan 03/25/2011 revealed no abnormal hypermetabolism in the neck, chest, abdomen, or pelvis. The prevascular soft tissue mass was smaller, measuring 1.9 x 3.6 cm -he began radiation consolidation to the chest on 04/27/2011, completed 05/24/2011. 2. Cough secondary to pneumonitis and obstruction from the mediastinal/left upper chest mass, resolved..  3. Left upper extremity deep vein thrombosis diagnosed while hospitalized September 2012. Coumadin was discontinued when the  Port-A-Cath was removed in April of 2013. 4. Postobstructive pneumonitis, status post Avelox therapy.  5. Fever/sweats secondary to number 1, resolved.  6. Status post placement of a right PICC line. The PICC was removed on 11/25/2010.  7. Status post Port-A-Cath placement 11/30/2010, removed 06/22/2011 8. History of tachycardia. An echocardiogram was unremarkable. The tachycardia has improved 9. Upper respiratory infection when he was here on 02/05/2011. He had a persistent cough 02/26/2011. A chest x-ray was negative on 02/26/2011.  10. Upper respiratory infection September 2013, improved following antibiotic therapy,negative chest x-ray 11/18/2011  11. History of left diaphragm paralysis following the diagnosis of non-Hodgkin's lymphoma-improved    Disposition:  [name] remains in clinical remission from non-Hodgkin's lymphoma. He will get an influenza vaccine with his primary physician. He will return for an office visit in 9 months.  Betsy Coder, MD  12/03/2014  9:06 AM

## 2014-12-03 NOTE — Telephone Encounter (Signed)
Gave and printed appt sched and avs for pt for June 2017 °

## 2015-03-26 ENCOUNTER — Other Ambulatory Visit (HOSPITAL_COMMUNITY): Payer: Self-pay | Admitting: Pulmonary Disease

## 2015-03-26 ENCOUNTER — Ambulatory Visit (HOSPITAL_COMMUNITY)
Admission: RE | Admit: 2015-03-26 | Discharge: 2015-03-26 | Disposition: A | Discharge status: Home / Self Care | Payer: BLUE CROSS/BLUE SHIELD | Source: Ambulatory Visit | Attending: Pulmonary Disease | Admitting: Pulmonary Disease

## 2015-03-26 DIAGNOSIS — R05 Cough: Secondary | ICD-10-CM

## 2015-03-26 DIAGNOSIS — R059 Cough, unspecified: Secondary | ICD-10-CM

## 2015-07-01 DIAGNOSIS — H6123 Impacted cerumen, bilateral: Secondary | ICD-10-CM | POA: Diagnosis not present

## 2015-07-01 DIAGNOSIS — J069 Acute upper respiratory infection, unspecified: Secondary | ICD-10-CM | POA: Diagnosis not present

## 2015-07-01 DIAGNOSIS — H6502 Acute serous otitis media, left ear: Secondary | ICD-10-CM | POA: Diagnosis not present

## 2015-09-02 ENCOUNTER — Telehealth: Payer: Self-pay | Admitting: Oncology

## 2015-09-02 ENCOUNTER — Ambulatory Visit (HOSPITAL_BASED_OUTPATIENT_CLINIC_OR_DEPARTMENT_OTHER): Payer: BLUE CROSS/BLUE SHIELD | Admitting: Oncology

## 2015-09-02 VITALS — BP 119/77 | HR 79 | Temp 98.4°F | Resp 18 | Ht 73.0 in | Wt 208.4 lb

## 2015-09-02 DIAGNOSIS — Z8572 Personal history of non-Hodgkin lymphomas: Secondary | ICD-10-CM | POA: Diagnosis not present

## 2015-09-02 DIAGNOSIS — C8332 Diffuse large B-cell lymphoma, intrathoracic lymph nodes: Secondary | ICD-10-CM

## 2015-09-02 NOTE — Progress Notes (Signed)
  [facility] OFFICE PROGRESS NOTE   Diagnosis: Non-Hodgkin's lymphoma  INTERVAL HISTORY:   Mr. Fickert returns as scheduled. He feels well. He reports a recent "sinus "infection. He was evaluated by an ENT physician. He is working. No fever, anorexia, or palpable lymph nodes.  Objective:  Vital signs in last 24 hours:  Blood pressure 119/77, pulse 79, temperature 98.4 F (36.9 C), temperature source Oral, resp. rate 18, height 6\' 1"  (1.854 m), weight 208 lb 6.4 oz (94.53 kg), SpO2 100 %.    HEENT: Neck without mass, no sinus tenderness Lymphatics: No cervical, supraclavicular, axillary, or inguinal nodes Resp: Lungs clear bilaterally with decreased breath sounds at the left base, no respiratory distress Cardio: Regular rate and rhythm GI: No hepatomegaly, no mass, nontender Vascular: No leg edema  Medications: I have reviewed the patient's current medications.  Assessment/Plan: 1. Diffuse large B-cell lymphoma involving a large anterior mediastinal mass, CD20 positive. He is status post cycle 1 CHOP/Rituxan chemotherapy 11/13/2010 (Rituxan was given on 11/14/2010). He completed cycle 2 CHOP/Rituxan 12/04/2010. He completed cycle #3 on October 19. He completed cycle #4 on 01/14/2011 -a restaging PET scan on November 27 shows a marketed improvement in the anterior mediastinal mass/hypermetabolic activity with a small remaining mediastinal mass with mildly increased FDG activity. He completed cycle 6 of CHOP/Rituxan on 02/26/2011. -restaging PET scan 03/25/2011 revealed no abnormal hypermetabolism in the neck, chest, abdomen, or pelvis. The prevascular soft tissue mass was smaller, measuring 1.9 x 3.6 cm -he began radiation consolidation to the chest on 04/27/2011, completed 05/24/2011. 2. Cough secondary to pneumonitis and obstruction from the mediastinal/left upper chest mass, resolved..  3. Left upper extremity deep vein thrombosis diagnosed while hospitalized September  2012. Coumadin was discontinued when the Port-A-Cath was removed in April of 2013. 4. Postobstructive pneumonitis, status post Avelox therapy.  5. Fever/sweats secondary to number 1, resolved.  6. Status post placement of a right PICC line. The PICC was removed on 11/25/2010.  7. Status post Port-A-Cath placement 11/30/2010, removed 06/22/2011 8. History of tachycardia. An echocardiogram was unremarkable. The tachycardia has improved 9. Upper respiratory infection when he was here on 02/05/2011. He had a persistent cough 02/26/2011. A chest x-ray was negative on 02/26/2011.  10. Upper respiratory infection September 2013, improved following antibiotic therapy,negative chest x-ray 11/18/2011  11. History of left diaphragm paralysis following the diagnosis of non-Hodgkin's lymphoma-improved     Disposition:  [name] remains in clinical remission from non-Hodgkin's lymphoma. He would like to continue follow[facility]up at the [facility]. He will return for an office visit in one year. Mr. Free will remain up-to-date only influenza vaccine.  Betsy Coder, MD  09/02/2015  8:52 AM

## 2015-09-02 NOTE — Telephone Encounter (Signed)
Gave pt avs °

## 2015-12-08 DIAGNOSIS — R05 Cough: Secondary | ICD-10-CM | POA: Diagnosis not present

## 2015-12-22 DIAGNOSIS — Z23 Encounter for immunization: Secondary | ICD-10-CM | POA: Diagnosis not present

## 2016-04-01 ENCOUNTER — Telehealth: Payer: Self-pay | Admitting: *Deleted

## 2016-04-01 NOTE — Telephone Encounter (Signed)
On 04-01-16 fax medical records to [employer], it was consult note, follow up note, sim & planning note, end of tx note

## 2016-08-31 ENCOUNTER — Telehealth: Payer: Self-pay | Admitting: Oncology

## 2016-08-31 ENCOUNTER — Ambulatory Visit (HOSPITAL_BASED_OUTPATIENT_CLINIC_OR_DEPARTMENT_OTHER): Payer: BLUE CROSS/BLUE SHIELD | Admitting: Oncology

## 2016-08-31 VITALS — BP 127/89 | HR 71 | Temp 98.2°F | Resp 18 | Ht 73.0 in | Wt 219.6 lb

## 2016-08-31 DIAGNOSIS — Z8572 Personal history of non-Hodgkin lymphomas: Secondary | ICD-10-CM

## 2016-08-31 DIAGNOSIS — C8332 Diffuse large B-cell lymphoma, intrathoracic lymph nodes: Secondary | ICD-10-CM

## 2016-08-31 NOTE — Telephone Encounter (Signed)
Gave patient avs report and appointment for June 2019.

## 2016-08-31 NOTE — Progress Notes (Signed)
  [facility] OFFICE PROGRESS NOTE   Diagnosis: Non-Hodgkin's lymphoma  INTERVAL HISTORY:   [name] returns as scheduled. He feels well. Good appetite and energy level. He has noted a subcutaneous nodule at the lower anterior chest.  Objective:  Vital signs in last 24 hours:  Blood pressure 127/89, pulse 71, temperature 98.2 F (36.8 C), temperature source Oral, resp. rate 18, height 6\' 1"  (1.854 m), weight 219 lb 9.6 oz (99.6 kg), SpO2 93 %.    HEENT: Neck without mass Lymphatics: No cervical, supraclavicular, axillary, or inguinal nodes Resp: Lungs clear bilaterally, slight decrease in breath sounds at the left lower posterior chest Cardio: Regular rate and rhythm GI: No hepatosplenomegaly, no mass, nontender Vascular: No leg edema  Skin: Oval 1 cm mobile subcutaneous lesion at the low anterior chest to the right of midline    Medications: I have reviewed the patient's current medications.  Assessment/Plan: 1. Diffuse large B-cell lymphoma involving a large anterior mediastinal mass, CD20 positive. He is status post cycle 1 CHOP/Rituxan chemotherapy 11/13/2010 (Rituxan was given on 11/14/2010). He completed cycle 2 CHOP/Rituxan 12/04/2010. He completed cycle #3 on October 19. He completed cycle #4 on 01/14/2011 -a restaging PET scan on November 27 shows a marketed improvement in the anterior mediastinal mass/hypermetabolic activity with a small remaining mediastinal mass with mildly increased FDG activity. He completed cycle 6 of CHOP/Rituxan on 02/26/2011. -restaging PET scan 03/25/2011 revealed no abnormal hypermetabolism in the neck, chest, abdomen, or pelvis. The prevascular soft tissue mass was smaller, measuring 1.9 x 3.6 cm -he began radiation consolidation to the chest on 04/27/2011, completed 05/24/2011. 2. Cough secondary to pneumonitis and obstruction from the mediastinal/left upper chest mass, resolved..  3. Left upper extremity deep vein thrombosis  diagnosed while hospitalized September 2012. Coumadin was discontinued when the Port-A-Cath was removed in April of 2013. 4. Postobstructive pneumonitis, status post Avelox therapy.  5. Fever/sweats secondary to number 1, resolved.  6. Status post placement of a right PICC line. The PICC was removed on 11/25/2010.  7. Status post Port-A-Cath placement 11/30/2010, removed 06/22/2011 8. History of tachycardia. An echocardiogram was unremarkable. The tachycardia has improved 9. Upper respiratory infection when he was here on 02/05/2011. He had a persistent cough 02/26/2011. A chest x-ray was negative on 02/26/2011.  10. Upper respiratory infection September 2013, improved following antibiotic therapy,negative chest x-ray 11/18/2011  11. History of left diaphragm paralysis following the diagnosis of non-Hodgkin's lymphoma-improved     Disposition:  [name] is in clinical remission from non-Hodgkin's lymphoma. He would like to continue follow-up at the [facility]. He will return for an office visit in one year. The cutaneous nodular lesion is likely a benign cyst or lipoma. He continues internal medicine care with Dr. Luan Pulling.  15 minutes were spent with the patient today. The majority of the time was used for counseling and coordination of care.  Donneta Romberg, MD  08/31/2016  9:30 AM

## 2016-09-07 DIAGNOSIS — R682 Dry mouth, unspecified: Secondary | ICD-10-CM | POA: Diagnosis not present

## 2016-09-07 DIAGNOSIS — D383 Neoplasm of uncertain behavior of mediastinum: Secondary | ICD-10-CM | POA: Diagnosis not present

## 2016-09-07 DIAGNOSIS — B370 Candidal stomatitis: Secondary | ICD-10-CM | POA: Diagnosis not present

## 2016-09-07 DIAGNOSIS — C8580 Other specified types of non-Hodgkin lymphoma, unspecified site: Secondary | ICD-10-CM | POA: Diagnosis not present

## 2016-10-04 DIAGNOSIS — Z8669 Personal history of other diseases of the nervous system and sense organs: Secondary | ICD-10-CM | POA: Diagnosis not present

## 2016-10-04 DIAGNOSIS — H6123 Impacted cerumen, bilateral: Secondary | ICD-10-CM | POA: Diagnosis not present

## 2016-10-04 DIAGNOSIS — J309 Allergic rhinitis, unspecified: Secondary | ICD-10-CM | POA: Diagnosis not present

## 2016-10-04 DIAGNOSIS — H93293 Other abnormal auditory perceptions, bilateral: Secondary | ICD-10-CM | POA: Diagnosis not present

## 2017-06-03 DIAGNOSIS — J40 Bronchitis, not specified as acute or chronic: Secondary | ICD-10-CM | POA: Diagnosis not present

## 2017-08-22 DIAGNOSIS — Z7289 Other problems related to lifestyle: Secondary | ICD-10-CM | POA: Diagnosis not present

## 2017-08-22 DIAGNOSIS — H6123 Impacted cerumen, bilateral: Secondary | ICD-10-CM | POA: Diagnosis not present

## 2017-08-22 DIAGNOSIS — H60502 Unspecified acute noninfective otitis externa, left ear: Secondary | ICD-10-CM | POA: Diagnosis not present

## 2017-08-30 ENCOUNTER — Ambulatory Visit: Payer: BLUE CROSS/BLUE SHIELD | Admitting: Oncology

## 2017-09-01 DIAGNOSIS — H838X1 Other specified diseases of right inner ear: Secondary | ICD-10-CM | POA: Diagnosis not present

## 2017-09-01 DIAGNOSIS — H60332 Swimmer's ear, left ear: Secondary | ICD-10-CM | POA: Diagnosis not present

## 2017-09-01 DIAGNOSIS — Z7289 Other problems related to lifestyle: Secondary | ICD-10-CM | POA: Diagnosis not present

## 2017-09-13 ENCOUNTER — Encounter: Payer: Self-pay | Admitting: Oncology

## 2017-09-13 ENCOUNTER — Telehealth: Payer: Self-pay | Admitting: Oncology

## 2017-09-13 ENCOUNTER — Inpatient Hospital Stay: Payer: BLUE CROSS/BLUE SHIELD | Attending: Oncology | Admitting: Oncology

## 2017-09-13 VITALS — BP 115/81 | HR 86 | Temp 98.6°F | Resp 18 | Ht 73.0 in | Wt 221.4 lb

## 2017-09-13 DIAGNOSIS — C8332 Diffuse large B-cell lymphoma, intrathoracic lymph nodes: Secondary | ICD-10-CM

## 2017-09-13 NOTE — Progress Notes (Signed)
  [place] OFFICE PROGRESS NOTE   Diagnosis: Non-Hodgkin's lymphoma  INTERVAL HISTORY:   [name] returns as scheduled.  He feels well.  He developed a painful rash at the lower left thigh approximately 2 weeks ago.  He saw Dr. Luan Ball and was prescribed Valtrex.  The rash is resolving. Good appetite and energy level.  No dyspnea.  Objective:  Vital signs in last 24 hours:  Blood pressure 115/81, pulse 86, temperature 98.6 F (37 C), temperature source Oral, resp. rate 18, height 6\' 1"  (1.854 m), weight 221 lb 6.4 oz (100.4 kg), SpO2 100 %.    HEENT: Neck without mass Lymphatics: No cervical, supraclavicular, axillary, or inguinal nodes Resp: Lungs clear bilaterally Cardio: Regular rate and rhythm GI: No hepatosplenomegaly, no mass, nontender Vascular: No leg edema  Skin: Cluster of scabbed lesions at the lower left lateral thigh, single erythematous pustular lesion measuring 2 to 3 mm at the lower left leg    Medications: I have reviewed the patient's current medications.   Assessment/Plan: 1. Diffuse large B-cell lymphoma involving a large anterior mediastinal mass, CD20 positive. He is status post cycle 1 CHOP/Rituxan chemotherapy 11/13/2010 (Rituxan was given on 11/14/2010). He completed cycle 2 CHOP/Rituxan 12/04/2010. He completed cycle #3 on October 19. He completed cycle #4 on 01/14/2011 -a restaging PET scan on November 27 shows a marketed improvement in the anterior mediastinal mass/hypermetabolic activity with a small remaining mediastinal mass with mildly increased FDG activity. He completed cycle 6 of CHOP/Rituxan on 02/26/2011. -restaging PET scan 03/25/2011 revealed no abnormal hypermetabolism in the neck, chest, abdomen, or pelvis. The prevascular soft tissue mass was smaller, measuring 1.9 x 3.6 cm -he began radiation consolidation to the chest on 04/27/2011, completed 05/24/2011. 2. Cough secondary to pneumonitis and obstruction from the  mediastinal/left upper chest mass, resolved..  3. Left upper extremity deep vein thrombosis diagnosed while hospitalized September 2012. Coumadin was discontinued when the Port-A-Cath was removed in April of 2013. 4. Postobstructive pneumonitis, status post Avelox therapy.  5. Fever/sweats secondary to number 1, resolved.  6. Status post placement of a right PICC line. The PICC was removed on 11/25/2010.  7. Status post Port-A-Cath placement 11/30/2010, removed 06/22/2011 8. History of tachycardia. An echocardiogram was unremarkable. The tachycardia has improved 9. Upper respiratory infection when he was here on 02/05/2011. He had a persistent cough 02/26/2011. A chest x-ray was negative on 02/26/2011.  10. Upper respiratory infection September 2013, improved following antibiotic therapy,negative chest x-ray 11/18/2011  11. 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  Left thigh rash June 2019- treated for zoster   Disposition: [name] remains in clinical remission from non-Hodgkin's lymphoma.  He appears to have a resolving zoster rash the left thigh.  He would like to [facility] the [facility].  He will return for an office visit in 1 year.  15 minutes were spent with the patient today.  The majority of the time was used for counseling and coordination of care.  Jim Coder, MD  09/13/2017  9:31 AM

## 2017-09-13 NOTE — Telephone Encounter (Signed)
Appointments scheduled AVS/Calendar printed per 7/9 los °

## 2017-11-03 DIAGNOSIS — H6121 Impacted cerumen, right ear: Secondary | ICD-10-CM | POA: Diagnosis not present

## 2017-11-03 DIAGNOSIS — H938X2 Other specified disorders of left ear: Secondary | ICD-10-CM | POA: Diagnosis not present

## 2018-09-14 ENCOUNTER — Encounter: Payer: Self-pay | Admitting: Nurse Practitioner

## 2018-09-14 ENCOUNTER — Inpatient Hospital Stay: Payer: BC Managed Care – PPO | Attending: Nurse Practitioner | Admitting: Nurse Practitioner

## 2018-09-14 ENCOUNTER — Other Ambulatory Visit: Payer: Self-pay

## 2018-09-14 VITALS — BP 121/77 | HR 83 | Temp 98.5°F | Resp 18 | Ht 73.0 in | Wt 213.8 lb

## 2018-09-14 DIAGNOSIS — Z923 Personal history of irradiation: Secondary | ICD-10-CM | POA: Insufficient documentation

## 2018-09-14 DIAGNOSIS — Z9221 Personal history of antineoplastic chemotherapy: Secondary | ICD-10-CM | POA: Diagnosis not present

## 2018-09-14 DIAGNOSIS — Z8572 Personal history of non-Hodgkin lymphomas: Secondary | ICD-10-CM | POA: Insufficient documentation

## 2018-09-14 DIAGNOSIS — C8332 Diffuse large B-cell lymphoma, intrathoracic lymph nodes: Secondary | ICD-10-CM

## 2018-09-14 NOTE — Progress Notes (Addendum)
  [facility] OFFICE PROGRESS NOTE   Diagnosis: Non-Hodgkin's lymphoma  INTERVAL HISTORY:   [name] returns as scheduled.  He feels well.  No interim illnesses or infections.  No fevers or sweats.  He describes his appetite as "great".  No weight loss.  He denies pain.  No shortness of breath.  Objective:  Vital signs in last 24 hours:  Blood pressure 121/77, pulse 83, temperature 98.5 F (36.9 C), temperature source Temporal, resp. rate 18, height 6\' 1"  (1.854 m), weight 213 lb 12.8 oz (97 kg), SpO2 100 %.    HEENT: Neck without mass. Lymphatics: No palpable cervical, supraclavicular, axillary or inguinal lymph nodes. GI: Abdomen soft.  No hepatosplenomegaly. Vascular: No leg edema.   Lab Results:  Lab Results  Component Value Date   WBC 4.7 08/24/2011   HGB 15.8 08/24/2011   HCT 44.8 08/24/2011   MCV 93.4 08/24/2011   PLT 245 08/24/2011   NEUTROABS 3.7 08/24/2011    Imaging:  No results found.  Medications: I have reviewed the patient's current medications.  Assessment/Plan: 1. Diffuse large B-cell lymphoma involving a large anterior mediastinal mass, CD20 positive. He is status post cycle 1 CHOP/Rituxan chemotherapy 11/13/2010 (Rituxan was given on 11/14/2010). He completed cycle 2 CHOP/Rituxan 12/04/2010. He completed cycle #3 on October 19. He completed cycle #4 on 01/14/2011 -a restaging PET scan on November 27 shows a marketed improvement in the anterior mediastinal mass/hypermetabolic activity with a small remaining mediastinal mass with mildly increased FDG activity. He completed cycle 6 of CHOP/Rituxan on 02/26/2011. -restaging PET scan 03/25/2011 revealed no abnormal hypermetabolism in the neck, chest, abdomen, or pelvis. The prevascular soft tissue mass was smaller, measuring 1.9 x 3.6 cm -he began radiation consolidation to the chest on 04/27/2011, completed 05/24/2011. 2. Cough secondary to pneumonitis and obstruction from the  mediastinal/left upper chest mass, resolved.  3. Left upper extremity deep vein thrombosis diagnosed while hospitalized September 2012. Coumadin was discontinued when the Port-A-Cath was removed in April of 2013. 4. Postobstructive pneumonitis, status post Avelox therapy.  5. Fever/sweats secondary to number 1, resolved.  6. Status post placement of a right PICC line. The PICC was removed on 11/25/2010.  7. Status post Port-A-Cath placement 11/30/2010, removed 06/22/2011 8. History of tachycardia. An echocardiogram was unremarkable. The tachycardia has improved 9. Upper respiratory infection when he was here on 02/05/2011. He had a persistent cough 02/26/2011. A chest x-ray was negative on 02/26/2011.  10. Upper respiratory infection September 2013, improved following antibiotic therapy,negative chest x-ray 11/18/2011  11. 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  Left thigh rash June 2019- treated for zoster   Disposition: [name] appears well.  He remains in clinical remission from non-Hodgkin's lymphoma.  He is close to 8 years out from diagnosis.  We discussed continued annual follow-up versus PRN follow-up.  He is comfortable with follow-up as needed.  He understands he can contact the office at any time with problems, questions, concerns.  Patient seen with Dr. Benay Spice.    Ned Card ANP/GNP-BC   09/14/2018  9:17 AM This was a shared visit with Ned Card.  [name] is in remission from [place].  He has a good prognosis for long-term disease-free survival.  He will continue clinical follow-up with his primary physician.  He will contact us for new symptoms.  We are available to see him in the future as needed.   Julieanne Manson, MD

## 2018-11-01 DIAGNOSIS — H6123 Impacted cerumen, bilateral: Secondary | ICD-10-CM | POA: Diagnosis not present

## 2019-03-14 DIAGNOSIS — Z23 Encounter for immunization: Secondary | ICD-10-CM | POA: Diagnosis not present

## 2019-04-11 DIAGNOSIS — Z23 Encounter for immunization: Secondary | ICD-10-CM | POA: Diagnosis not present

## 2019-09-12 DIAGNOSIS — Z0189 Encounter for other specified special examinations: Secondary | ICD-10-CM | POA: Diagnosis not present

## 2019-09-12 DIAGNOSIS — R002 Palpitations: Secondary | ICD-10-CM | POA: Diagnosis not present

## 2019-09-25 DIAGNOSIS — H6123 Impacted cerumen, bilateral: Secondary | ICD-10-CM | POA: Diagnosis not present

## 2019-10-09 DIAGNOSIS — R7301 Impaired fasting glucose: Secondary | ICD-10-CM | POA: Diagnosis not present

## 2019-10-09 DIAGNOSIS — Z0000 Encounter for general adult medical examination without abnormal findings: Secondary | ICD-10-CM | POA: Diagnosis not present

## 2019-10-12 DIAGNOSIS — Z0001 Encounter for general adult medical examination with abnormal findings: Secondary | ICD-10-CM | POA: Diagnosis not present

## 2019-10-12 DIAGNOSIS — Z136 Encounter for screening for cardiovascular disorders: Secondary | ICD-10-CM | POA: Diagnosis not present

## 2019-10-19 ENCOUNTER — Telehealth: Payer: Self-pay | Admitting: General Practice

## 2019-10-23 NOTE — Telephone Encounter (Signed)
Patient scheduled with Dr. Melvyn Novas in [place] on 10/4-pr

## 2019-11-01 ENCOUNTER — Other Ambulatory Visit: Payer: Self-pay

## 2019-11-01 DIAGNOSIS — Z20822 Contact with and (suspected) exposure to covid-19: Secondary | ICD-10-CM

## 2019-11-03 LAB — SARS-COV-2, NAA 2 DAY TAT

## 2019-11-03 LAB — NOVEL CORONAVIRUS, NAA: SARS-CoV-2, NAA: NOT DETECTED

## 2019-12-06 ENCOUNTER — Other Ambulatory Visit: Payer: Self-pay

## 2019-12-06 ENCOUNTER — Other Ambulatory Visit: Payer: BC Managed Care – PPO

## 2019-12-06 DIAGNOSIS — Z20822 Contact with and (suspected) exposure to covid-19: Secondary | ICD-10-CM

## 2019-12-08 LAB — NOVEL CORONAVIRUS, NAA: SARS-CoV-2, NAA: NOT DETECTED

## 2019-12-08 LAB — SPECIMEN STATUS REPORT

## 2019-12-08 LAB — SARS-COV-2, NAA 2 DAY TAT

## 2019-12-10 ENCOUNTER — Institutional Professional Consult (permissible substitution): Payer: BC Managed Care – PPO | Admitting: Internal Medicine

## 2019-12-10 ENCOUNTER — Other Ambulatory Visit: Payer: Self-pay

## 2019-12-10 ENCOUNTER — Ambulatory Visit (INDEPENDENT_AMBULATORY_CARE_PROVIDER_SITE_OTHER): Payer: BC Managed Care – PPO | Admitting: Internal Medicine

## 2019-12-10 ENCOUNTER — Encounter: Payer: Self-pay | Admitting: Internal Medicine

## 2019-12-10 ENCOUNTER — Other Ambulatory Visit (HOSPITAL_COMMUNITY)
Admission: RE | Admit: 2019-12-10 | Discharge: 2019-12-10 | Disposition: A | Discharge status: Home / Self Care | Payer: BC Managed Care – PPO | Source: Ambulatory Visit | Attending: Internal Medicine | Admitting: Internal Medicine

## 2019-12-10 ENCOUNTER — Ambulatory Visit (HOSPITAL_COMMUNITY)
Admission: RE | Admit: 2019-12-10 | Discharge: 2019-12-10 | Disposition: A | Discharge status: Home / Self Care | Payer: BC Managed Care – PPO | Source: Ambulatory Visit | Attending: Internal Medicine | Admitting: Internal Medicine

## 2019-12-10 DIAGNOSIS — R0609 Other forms of dyspnea: Secondary | ICD-10-CM

## 2019-12-10 DIAGNOSIS — R0600 Dyspnea, unspecified: Secondary | ICD-10-CM

## 2019-12-10 DIAGNOSIS — R0602 Shortness of breath: Secondary | ICD-10-CM | POA: Diagnosis not present

## 2019-12-10 NOTE — Progress Notes (Signed)
[name], male    DOB: 11/27/1980,     MRN: [id number]   Brief patient profile:  60 yowm never smoker dx with NHL B cell last 2012 released by [facility]  March 2021  referred to pulmonary clinic in [place]  12/10/2019 by Dr  [place] Crane.  1. Diffuse large B-cell lymphoma involving a large anterior mediastinal mass, CD20 positive. He is status post cycle 1 CHOP/Rituxan chemotherapy 11/13/2010 (Rituxan was given on 11/14/2010). He completed cycle 2 CHOP/Rituxan 12/04/2010. He completed cycle #3 on October 19. He completed cycle #4 on 01/14/2011 -a restaging PET scan on November 27 shows a marketed improvement in the anterior mediastinal mass/hypermetabolic activity with a small remaining mediastinal mass with mildly increased FDG activity. He completed cycle 6 of CHOP/Rituxan on 02/26/2011. -restaging PET scan 03/25/2011 revealed no abnormal hypermetabolism in the neck, chest, abdomen, or pelvis. The prevascular soft tissue mass was smaller, measuring 1.9 x 3.6 cm -he began radiation consolidation to the chest on 04/27/2011, completed 05/24/2011. 2. Cough secondary to pneumonitis and obstruction from the mediastinal/left upper chest mass, resolved.  3. Left upper extremity deep vein thrombosis diagnosed while hospitalized September 2012. Coumadin was discontinued when the Port-A-Cath was removed in April of 2013. 4. Postobstructive pneumonitis, status post Avelox therapy.  5. Fever/sweats secondary to number 1, resolved.  6. Status post placement of a right PICC line. The PICC was removed on 11/25/2010.  7. Status post Port-A-Cath placement 11/30/2010, removed 06/22/2011 8. History of tachycardia. An echocardiogram was unremarkable. The tachycardia has improved 9. Upper respiratory infection when he was here on 02/05/2011. He had a persistent cough 02/26/2011. A chest x-ray was negative on 02/26/2011.  10. Upper respiratory infection September 2013, improved following antibiotic  therapy,negative chest x-ray 11/18/2011  11. 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 Pulmonary Consult    Former patient of Dr Luan Pulling. Pt wanted to f/u since finishing tx for Lymphoma. He denies any respiratory co's today.   Dyspnea:  Works at [employer], puts up [employer] / no longer working out at [employer] since pandemic and no regular aerobics  Cough: none  Sleep: able to lie flat 1-2 pillows  SABA use: none   No obvious day to day or daytime variability or assoc excess/ purulent sputum or mucus plugs or hemoptysis or cp or chest tightness, subjective wheeze or overt sinus or hb symptoms.   sleeping without nocturnal  or early am exacerbation  of respiratory  c/o's or need for noct saba. Also denies any obvious fluctuation of symptoms with weather or environmental changes or other aggravating or alleviating factors except as outlined above   No unusual exposure hx or h/o childhood pna/ asthma or knowledge of premature birth.  Current Allergies, Complete Past Medical History, Past Surgical History, Family History, and Social History were reviewed in [employer] record.  ROS  The following are not active complaints unless bolded Hoarseness, sore throat, dysphagia, dental problems, itching, sneezing,  nasal congestion or discharge of excess mucus or purulent secretions, ear ache,   fever, chills, sweats, unintended wt loss or wt gain, classically pleuritic or exertional cp,  orthopnea pnd or arm/hand swelling  or leg swelling, presyncope, palpitations, abdominal pain, anorexia, nausea, vomiting, diarrhea  or change in bowel habits or change in bladder habits, change  in stools or change in urine, dysuria, hematuria,  rash, arthralgias, visual complaints, headache, numbness,  weakness or ataxia or problems with walking or coordination,  change in mood or  memory.           Past Medical History:  Diagnosis Date  . Allergy   . Cancer ([place]) 11/06/2010   NHL-Diffuse large B-cell lymphoma  . DVT (deep venous thrombosis) ([place]) 11/2010   LUE  . Dyspnea   . History of DVT (deep vein thrombosis) 11/2010   left upper extremity   . Lymphoma ([facility])   . 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 Ibuprofen (ADVIL) 200 MG CAPS Take 400 mg by mouth as needed. Reported on 09/02/2015     No facility-administered medications prior to visit.     Objective:     BP (!) 142/90 (BP Location: Left Arm, Cuff Size: Normal)   Pulse 93   Temp 97.6 F (36.4 C) (Temporal)   Ht 6' (1.829 m)   Wt 217 lb 12.8 oz (98.8 kg)   SpO2 97% Comment: on RA  BMI 29.54 kg/m   SpO2: 97 % (on RA)   Robust pleasantly verbose amb wm nad   HEENT : pt wearing mask not removed for exam due to covid -19 concerns.    NECK :  without JVD/Nodes/TM/ nl carotid upstrokes bilaterally   LUNGS: no acc muscle use,  Nl contour chest which is clear to A and P bilaterally without cough on insp or exp maneuvers   CV:  RRR  no s3 or murmur or increase in P2, and no edema   ABD:  soft and nontender with nl inspiratory excursion in the supine position. No bruits or organomegaly appreciated, bowel sounds nl  MS:  Nl gait/ ext warm without deformities, calf tenderness, cyanosis or clubbing No obvious joint restrictions   SKIN: warm and dry without lesions    NEURO:  alert, approp, nl sensorium with  no motor or cerebellar deficits apparent.       CXR PA and Lateral:   12/10/2019 :    I personally reviewed images and agree with radiology impression as follows:  Mild bronchitic changes. No focal pulmonary infiltrate.      Assessment   DOE (dyspnea on exertion) No apparent pulmonary sequelae from  either his B cell lymphoma or the treatment thereof though note he is not back doing any kind of regular aerobic activity.  Assured him that regular sub max exercise and monitoring sats occasionally at highest aerobic level are the best way to assure lung health and that any surveillance for B cell lymphoma recurrence should be directed by oncology (last note suggests that is not necessary) so f/u here can be prn pulmonary symptoms.            Total time for H and P, extensive chart review, counseling,  and generating customized AVS unique to this office visit / charting = 50 min          Christinia Gully, MD 12/10/2019

## 2019-12-10 NOTE — Patient Instructions (Addendum)
To get the most out of exercise, you need to be continuously aware that you are short of breath, but never out of breath, for 30 minutes at least three times a week. As you improve, it will actually be easier for you to do the same amount of exercise  in  30 minutes so always push to the level where you are short of breath.     Make sure you check your oxygen saturations at highest level of activity to be sure it stays over 90% and keep track of it at least once a week, more often if breathing getting worse, and let me know if losing ground.    Keep up with your vaccinations per Dr Juel Burrow office - you  Have had Prevnar  13 2015 with pneumovax 2016 so due for pneumovax in 2013   Please remember to go to the lab and x-ray department at [facility]   for your tests - we will call you with the results when they are available.       Pulmonary follow up is as needed

## 2019-12-11 ENCOUNTER — Encounter: Payer: Self-pay | Admitting: Internal Medicine

## 2019-12-11 LAB — ALPHA-1 ANTITRYPSIN PHENOTYPE: A-1 Antitrypsin, Ser: 119 mg/dL (ref 95–164)

## 2019-12-11 NOTE — Progress Notes (Signed)
Left detailed msg on machine with results ok per DPR

## 2019-12-11 NOTE — Assessment & Plan Note (Addendum)
No apparent pulmonary sequelae from either his B cell lymphoma or the treatment thereof though note he is not back doing any kind of regular aerobic activity.  Assured him that regular sub max exercise and monitoring sats occasionally at highest aerobic level are the best way to assure lung health and that any surveillance for B cell lymphoma recurrence should be directed by oncology (last note suggests that is not necessary) so f/u here can be prn pulmonary symptoms.            Total time for H and P, extensive chart review, counseling,  and generating customized AVS unique to this office visit / charting = 50 min

## 2019-12-13 NOTE — Progress Notes (Signed)
Called and left detailed msg on machine ok per DPR.

## 2019-12-14 ENCOUNTER — Encounter: Payer: Self-pay | Admitting: *Deleted

## 2019-12-17 ENCOUNTER — Encounter: Payer: Self-pay | Admitting: *Deleted

## 2019-12-17 ENCOUNTER — Encounter: Payer: Self-pay | Admitting: Cardiology

## 2019-12-17 ENCOUNTER — Ambulatory Visit (INDEPENDENT_AMBULATORY_CARE_PROVIDER_SITE_OTHER): Payer: BC Managed Care – PPO | Admitting: Cardiology

## 2019-12-17 VITALS — BP 114/76 | HR 102 | Ht 72.0 in | Wt 217.6 lb

## 2019-12-17 DIAGNOSIS — R002 Palpitations: Secondary | ICD-10-CM | POA: Diagnosis not present

## 2019-12-17 DIAGNOSIS — R9431 Abnormal electrocardiogram [ECG] [EKG]: Secondary | ICD-10-CM

## 2019-12-17 NOTE — Progress Notes (Signed)
Cardiology Office Note  Date: 12/17/2019   ID: Mel Almond, DOB 1980/08/25, MRN [id number]  PCP:  Celene Squibb, MD  Cardiologist:  Rozann Lesches, MD Electrophysiologist:  None   Chief Complaint  Patient presents with  . History of palpitations    History of Present Illness: WOODS GANGEMI is a 39 y.o. male referred for cardiology consultation by Dr. Nevada Crane for the evaluation of intermittent palpitations.  I reviewed his records and updated the chart.  He states that back in July he experienced a few episodes during one week as if he were having a "panic attack."  Heart rate was rapid, he is felt anxious and short of breath.  There was no obvious precipitant to the symptoms.  He did not have syncope.  Since then he has not experienced any recurrence.  He has a very remote history of similar symptoms about 10 years ago.  Medical history includes diffuse large B-cell lymphoma status post chemotherapy and radiation.  I personally reviewed his ECG today which shows sinus tachycardia with borderline increased voltage, decreased R wave progression.  He does mention that he has lost some weight through changes in his diet, also cut back significantly on caffeinated soft drinks.   Past Medical History:  Diagnosis Date  . Diffuse large B-cell lymphoma (HCC)    Mediastinal mass - CHOP/Rituxan  . History of DVT (deep vein thrombosis) 11/2010   Left upper extremity  . Pneumonitis    Associated with mediastinal mass    Past Surgical History:  Procedure Laterality Date  . Insertion of right IJ Port-A-Cath.  11/30/10   [place]  . MEDIASTINOTOMY  11/06/2010   Lt anterior  mediastinal mass, rule out lymphoma  . PORT-A-CATH REMOVAL  06/22/2011   Procedure: REMOVAL PORT-A-CATH;  Surgeon: Nicanor Alcon, MD;  Location: [facility];  Service: Thoracic;  Laterality: Right;  . PORTACATH PLACEMENT  11/2010  . TYMPANIC MEMBRANE REPAIR    . WISDOM TOOTH EXTRACTION      No current outpatient  medications on file.   No current facility-administered medications for this visit.   Allergies:  Patient has no known allergies.   Social History: The patient  reports that he has never smoked. He has never used smokeless tobacco. He reports current alcohol use of about 1.0 standard drink of alcohol per week. He reports that he does not use drugs.   Family History: The patient's family history includes Cancer in his maternal grandmother.   ROS:  No syncope.  Physical Exam: VS:  BP 114/76   Pulse (!) 102   Ht 6' (1.829 m)   Wt 217 lb 9.6 oz (98.7 kg)   SpO2 96%   BMI 29.51 kg/m , BMI Body mass index is 29.51 kg/m.  Wt Readings from Last 3 Encounters:  12/17/19 217 lb 9.6 oz (98.7 kg)  12/10/19 217 lb 12.8 oz (98.8 kg)  09/14/18 213 lb 12.8 oz (97 kg)    General: Patient appears comfortable at rest. HEENT: Conjunctiva and lids normal, wearing a mask. Neck: Supple, no elevated JVP or carotid bruits, no thyromegaly. Lungs: Clear to auscultation, nonlabored breathing at rest. Cardiac: Regular rate and rhythm, no S3 or significant systolic murmur, no pericardial rub. Extremities: No pitting edema, distal pulses 2+. Skin: Warm and dry. Musculoskeletal: No kyphosis. Neuropsychiatric: Alert and oriented x3, affect grossly appropriate.  ECG:  An ECG dated 11/26/2010 was personally reviewed today and demonstrated:  Sinus tachycardia.  Recent Labwork:  No recent  lab work for review today.  Other Studies Reviewed Today:  No prior cardiac testing for review today.  Assessment and Plan:  History of intermittent palpitations as described above, no associated syncope.  ECG today shows sinus tachycardia with borderline increased voltage and decreased R wave progression.  He has no previously documented history of cardiac arrhythmia.  He did undergo left-sided chest radiation as part of his therapy for diffuse large B-cell lymphoma.  Plan is to obtain an echocardiogram to ensure normal  cardiac structure and function.  If he has recurring symptoms with any regularity, further outpatient monitoring will be pursued.  Medication Adjustments/Labs and Tests Ordered: Current medicines are reviewed at length with the patient today.  Concerns regarding medicines are outlined above.   Tests Ordered: Orders Placed This Encounter  Procedures  . EKG 12-Lead  . ECHOCARDIOGRAM COMPLETE    Medication Changes: No orders of the defined types were placed in this encounter.   Disposition:  Follow up 1 year in the [place] office.  Signed, Satira Sark, MD, [facility] 12/17/2019 3:22 PM    [facility] [place], [place], [place] [address] Phone: [phone]; Fax: [phone]

## 2019-12-17 NOTE — Patient Instructions (Addendum)

## 2019-12-18 ENCOUNTER — Other Ambulatory Visit: Payer: Self-pay | Admitting: Cardiology

## 2019-12-18 DIAGNOSIS — R9431 Abnormal electrocardiogram [ECG] [EKG]: Secondary | ICD-10-CM

## 2020-01-02 ENCOUNTER — Telehealth: Payer: Self-pay | Admitting: *Deleted

## 2020-01-02 ENCOUNTER — Ambulatory Visit (INDEPENDENT_AMBULATORY_CARE_PROVIDER_SITE_OTHER): Payer: BC Managed Care – PPO

## 2020-01-02 DIAGNOSIS — R9431 Abnormal electrocardiogram [ECG] [EKG]: Secondary | ICD-10-CM

## 2020-01-02 LAB — ECHOCARDIOGRAM COMPLETE
Area-P 1/2: 4.1 cm2
Calc EF: 60.2 %
MV M vel: 4.06 m/s
MV Peak grad: 65.8 mmHg
S' Lateral: 2.33 cm
Single Plane A2C EF: 56.9 %
Single Plane A4C EF: 63.4 %

## 2020-01-02 NOTE — Telephone Encounter (Signed)
-----   Message from Satira Sark, MD sent at 01/02/2020  1:13 PM EDT ----- Results reviewed.  Please let him know that cardiac function is overall normal with LVEF 55 to 60%.  Mild mitral regurgitation, not clinically significant.  Overall reassuring.  Would not anticipate any further cardiac testing unless he develops recurring palpitations at which point outpatient monitoring could be considered.  Would keep follow-up with Dr. Nevada Crane.

## 2020-01-02 NOTE — Telephone Encounter (Signed)
Patient informed. Copy sent to PCP °

## 2020-02-15 DIAGNOSIS — R002 Palpitations: Secondary | ICD-10-CM | POA: Diagnosis not present

## 2020-02-15 DIAGNOSIS — Z1152 Encounter for screening for COVID-19: Secondary | ICD-10-CM | POA: Diagnosis not present

## 2020-02-15 DIAGNOSIS — J069 Acute upper respiratory infection, unspecified: Secondary | ICD-10-CM | POA: Diagnosis not present

## 2020-02-15 DIAGNOSIS — R7301 Impaired fasting glucose: Secondary | ICD-10-CM | POA: Diagnosis not present

## 2020-02-15 DIAGNOSIS — E782 Mixed hyperlipidemia: Secondary | ICD-10-CM | POA: Diagnosis not present

## 2020-02-15 DIAGNOSIS — R945 Abnormal results of liver function studies: Secondary | ICD-10-CM | POA: Diagnosis not present

## 2020-05-01 DIAGNOSIS — R0981 Nasal congestion: Secondary | ICD-10-CM | POA: Diagnosis not present

## 2020-05-01 DIAGNOSIS — Z1152 Encounter for screening for COVID-19: Secondary | ICD-10-CM | POA: Diagnosis not present

## 2020-05-05 DIAGNOSIS — M545 Low back pain, unspecified: Secondary | ICD-10-CM | POA: Diagnosis not present

## 2020-07-22 ENCOUNTER — Encounter: Payer: Self-pay | Admitting: Emergency Medicine

## 2020-07-22 ENCOUNTER — Ambulatory Visit
Admission: EM | Admit: 2020-07-22 | Discharge: 2020-07-22 | Disposition: A | Discharge status: Home / Self Care | Payer: BC Managed Care – PPO | Attending: Family Medicine | Admitting: Family Medicine

## 2020-07-22 ENCOUNTER — Other Ambulatory Visit: Payer: Self-pay

## 2020-07-22 DIAGNOSIS — R1032 Left lower quadrant pain: Secondary | ICD-10-CM

## 2020-07-22 MED ORDER — DICYCLOMINE HCL 20 MG PO TABS: 20.0000 mg | ORAL_TABLET | Freq: Two times a day (BID) | ORAL | 0 refills | Status: DC

## 2020-07-22 NOTE — Discharge Instructions (Signed)
Dicyclomine prescribed to take up to twice a day for abdominal pain and cramping  CBC, CMP, lispase drawn in the office  We will follow up with abnormal labs as needed  Follow up with this office or with primary care if symptoms are persisting.  Follow up in the ER for high fever, trouble swallowing, trouble breathing, other concerning symptoms.

## 2020-07-22 NOTE — ED Triage Notes (Signed)
LLQ pain that started Saturday night. Last bowel movement yesterday

## 2020-07-22 NOTE — ED Provider Notes (Signed)
[facility]    CSN: [id number] Arrival date & time: 07/22/20  0347      History   Chief Complaint Chief Complaint  Patient presents with  . Abdominal Pain    HPI [name] is a 40 y.o. male.   Reports LLQ pain for the last 2-3 days. Has not attempted OTC treatment. Denies hx diverticulitis. Reports that he has had this pain in the past but that it did not last this long before. States that he thought it was gas at first. Has had normal bowel movement yesterday. States that he had spicy food for dinner and thought that this was causing his symptoms. Also reports an episode of chills overnight last night. Denies sick contacts. Denies nausea, vomiting, diarrhea, rash, fever, other symptoms.  ROS per HPI  The history is provided by the patient.  Abdominal Pain   Past Medical History:  Diagnosis Date  . Diffuse large B-cell lymphoma (HCC)    Mediastinal mass - CHOP/Rituxan  . History of DVT (deep vein thrombosis) 11/2010   Left upper extremity  . Pneumonitis    Associated with mediastinal mass    Patient Active Problem List   Diagnosis Date Noted  . DVT of upper extremity (deep vein thrombosis) ([place]) 01/03/2011  . Lymphoma ([place])   . Mediastinal tumor 11/02/2010  . Palpitations 10/15/2008  . DOE (dyspnea on exertion) 10/15/2008  . CHEST DISCOMFORT 10/15/2008    Past Surgical History:  Procedure Laterality Date  . Insertion of right IJ Port-A-Cath.  11/30/10   [place]  . MEDIASTINOTOMY  11/06/2010   Lt anterior  mediastinal mass, rule out lymphoma  . PORT-A-CATH REMOVAL  06/22/2011   Procedure: REMOVAL PORT-A-CATH;  Surgeon: Nicanor Alcon, MD;  Location: [facility];  Service: Thoracic;  Laterality: Right;  . PORTACATH PLACEMENT  11/2010  . TYMPANIC MEMBRANE REPAIR    . WISDOM TOOTH EXTRACTION         Home Medications    Prior to Admission medications   Medication Sig Start Date End Date Taking? Authorizing Provider  dicyclomine (BENTYL) 20 MG  tablet Take 1 tablet (20 mg total) by mouth 2 (two) times daily. 07/22/20  Yes Faustino Congress, NP    Family History Family History  Problem Relation Age of Onset  . Cancer Maternal Grandmother        sinus cavity  . Anesthesia problems Neg Hx     Social History Social History   Tobacco Use  . Smoking status: Never Smoker  . Smokeless tobacco: Never Used  Substance Use Topics  . Alcohol use: Yes    Alcohol/week: 1.0 standard drink    Types: 1 Cans of beer per week    Comment: occasional  5-5 beers in last 4 months  . Drug use: No     Allergies   Patient has no known allergies.   Review of Systems Review of Systems  Gastrointestinal: Positive for abdominal pain.     Physical Exam Triage Vital Signs ED Triage Vitals  Enc Vitals Group     BP 07/22/20 0939 112/79     Pulse Rate 07/22/20 0939 91     Resp 07/22/20 0939 17     Temp 07/22/20 0939 98.5 F (36.9 C)     Temp Source 07/22/20 0939 Oral     SpO2 07/22/20 0939 94 %     Weight --      Height --      Head Circumference --  Peak Flow --      Pain Score 07/22/20 0938 2     Pain Loc --      Pain Edu? --      Excl. in [place]? --    No data found.  Updated Vital Signs BP 112/79 (BP Location: Right Arm)   Pulse 91   Temp 98.5 F (36.9 C) (Oral)   Resp 17   SpO2 94%       Physical Exam Vitals and nursing note reviewed.  Constitutional:      General: He is not in acute distress.    Appearance: He is well-developed. He is not ill-appearing.  HENT:     Head: Normocephalic and atraumatic.     Mouth/Throat:     Mouth: Mucous membranes are moist.     Pharynx: Oropharynx is clear.  Eyes:     Extraocular Movements: Extraocular movements intact.     Conjunctiva/sclera: Conjunctivae normal.     Pupils: Pupils are equal, round, and reactive to light.  Cardiovascular:     Rate and Rhythm: Normal rate and regular rhythm.     Heart sounds: Normal heart sounds. No murmur heard.   Pulmonary:      Effort: Pulmonary effort is normal. No respiratory distress.     Breath sounds: Normal breath sounds. No stridor. No wheezing, rhonchi or rales.  Chest:     Chest wall: No tenderness.  Abdominal:     General: Bowel sounds are normal. There is no distension or abdominal bruit. There are no signs of injury.     Palpations: Abdomen is soft. There is no shifting dullness, fluid wave, hepatomegaly, splenomegaly, mass or pulsatile mass.     Tenderness: There is no abdominal tenderness.  Musculoskeletal:     Cervical back: Neck supple.  Skin:    General: Skin is warm and dry.     Capillary Refill: Capillary refill takes less than 2 seconds.  Neurological:     General: No focal deficit present.     Mental Status: He is alert.  Psychiatric:        Mood and Affect: Mood normal.        Behavior: Behavior normal.      UC Treatments / Results  Labs (all labs ordered are listed, but only abnormal results are displayed) Labs Reviewed  CBC WITH DIFFERENTIAL/PLATELET  COMPREHENSIVE METABOLIC PANEL  LIPASE    EKG   Radiology No results found.  Procedures Procedures (including critical care time)  Medications Ordered in UC Medications - No data to display  Initial Impression / Assessment and Plan / UC Course  I have reviewed the triage vital signs and the nursing notes.  Pertinent labs & imaging results that were available during my care of the patient were reviewed by me and considered in my medical decision making (see chart for details).    LLQ Pain  Prescribed dicyclomine BID prn abdominal pain and cramping CBC, CMP, lipase drawn Will be in contact with abnormal results as needed and will treat accordingly Follow up with PCP as scheduled Follow up in the ER for unrelenting pain, bloody/black stool, high fever, trouble swallowing, trouble breathing, other concerning symptoms  Final Clinical Impressions(s) / UC Diagnoses   Final diagnoses:  LLQ pain     Discharge  Instructions     Dicyclomine prescribed to take up to twice a day for abdominal pain and cramping  CBC, CMP, lispase drawn in the office  We will follow up with abnormal labs  as needed  Follow up with this office or with primary care if symptoms are persisting.  Follow up in the ER for high fever, trouble swallowing, trouble breathing, other concerning symptoms.     ED Prescriptions    Medication Sig Dispense Auth. Provider   dicyclomine (BENTYL) 20 MG tablet Take 1 tablet (20 mg total) by mouth 2 (two) times daily. 20 tablet Faustino Congress, NP     PDMP not reviewed this encounter.   Faustino Congress, NP 07/22/20 1052

## 2020-07-23 DIAGNOSIS — K5792 Diverticulitis of intestine, part unspecified, without perforation or abscess without bleeding: Secondary | ICD-10-CM | POA: Diagnosis not present

## 2020-07-23 LAB — COMPREHENSIVE METABOLIC PANEL
ALT: 46 IU/L — ABNORMAL HIGH (ref 0–44)
AST: 24 IU/L (ref 0–40)
Albumin/Globulin Ratio: 2.4 — ABNORMAL HIGH (ref 1.2–2.2)
Albumin: 4.8 g/dL (ref 4.0–5.0)
Alkaline Phosphatase: 85 IU/L (ref 44–121)
BUN/Creatinine Ratio: 14 (ref 9–20)
BUN: 13 mg/dL (ref 6–20)
Bilirubin Total: 0.6 mg/dL (ref 0.0–1.2)
CO2: 20 mmol/L (ref 20–29)
Calcium: 10.1 mg/dL (ref 8.7–10.2)
Chloride: 101 mmol/L (ref 96–106)
Creatinine, Ser: 0.95 mg/dL (ref 0.76–1.27)
Globulin, Total: 2 g/dL (ref 1.5–4.5)
Glucose: 162 mg/dL — ABNORMAL HIGH (ref 65–99)
Potassium: 4.2 mmol/L (ref 3.5–5.2)
Sodium: 140 mmol/L (ref 134–144)
Total Protein: 6.8 g/dL (ref 6.0–8.5)
eGFR: 104 mL/min/{1.73_m2} (ref >59)

## 2020-07-23 LAB — CBC WITH DIFFERENTIAL/PLATELET
Basophils Absolute: 0.1 10*3/uL (ref 0.0–0.2)
Basos: 1 %
EOS (ABSOLUTE): 0.1 10*3/uL (ref 0.0–0.4)
Eos: 1 %
Hematocrit: 48.4 % (ref 37.5–51.0)
Hemoglobin: 16.7 g/dL (ref 13.0–17.7)
Immature Grans (Abs): 0 10*3/uL (ref 0.0–0.1)
Immature Granulocytes: 0 %
Lymphocytes Absolute: 1.6 10*3/uL (ref 0.7–3.1)
Lymphs: 24 %
MCH: 31.1 pg (ref 26.6–33.0)
MCHC: 34.5 g/dL (ref 31.5–35.7)
MCV: 90 fL (ref 79–97)
Monocytes Absolute: 0.5 10*3/uL (ref 0.1–0.9)
Monocytes: 7 %
Neutrophils Absolute: 4.4 10*3/uL (ref 1.4–7.0)
Neutrophils: 67 %
Platelets: 295 10*3/uL (ref 150–450)
RBC: 5.37 x10E6/uL (ref 4.14–5.80)
RDW: 13.1 % (ref 11.6–15.4)
WBC: 6.5 10*3/uL (ref 3.4–10.8)

## 2020-07-23 LAB — LIPASE: Lipase: 16 U/L (ref 13–78)

## 2020-08-06 DIAGNOSIS — R002 Palpitations: Secondary | ICD-10-CM | POA: Diagnosis not present

## 2020-08-06 DIAGNOSIS — R7301 Impaired fasting glucose: Secondary | ICD-10-CM | POA: Diagnosis not present

## 2020-08-06 DIAGNOSIS — E782 Mixed hyperlipidemia: Secondary | ICD-10-CM | POA: Diagnosis not present

## 2020-08-07 DIAGNOSIS — E782 Mixed hyperlipidemia: Secondary | ICD-10-CM | POA: Diagnosis not present

## 2020-08-07 DIAGNOSIS — R945 Abnormal results of liver function studies: Secondary | ICD-10-CM | POA: Diagnosis not present

## 2020-08-07 DIAGNOSIS — K625 Hemorrhage of anus and rectum: Secondary | ICD-10-CM | POA: Diagnosis not present

## 2020-08-07 DIAGNOSIS — R002 Palpitations: Secondary | ICD-10-CM | POA: Diagnosis not present

## 2020-08-07 DIAGNOSIS — R1032 Left lower quadrant pain: Secondary | ICD-10-CM | POA: Diagnosis not present

## 2020-08-07 DIAGNOSIS — R7301 Impaired fasting glucose: Secondary | ICD-10-CM | POA: Diagnosis not present

## 2020-08-07 DIAGNOSIS — K219 Gastro-esophageal reflux disease without esophagitis: Secondary | ICD-10-CM | POA: Diagnosis not present

## 2020-08-13 DIAGNOSIS — R945 Abnormal results of liver function studies: Secondary | ICD-10-CM | POA: Diagnosis not present

## 2020-08-13 DIAGNOSIS — R002 Palpitations: Secondary | ICD-10-CM | POA: Diagnosis not present

## 2020-08-13 DIAGNOSIS — E782 Mixed hyperlipidemia: Secondary | ICD-10-CM | POA: Diagnosis not present

## 2020-08-13 DIAGNOSIS — R7301 Impaired fasting glucose: Secondary | ICD-10-CM | POA: Diagnosis not present

## 2020-08-13 DIAGNOSIS — Z8572 Personal history of non-Hodgkin lymphomas: Secondary | ICD-10-CM | POA: Diagnosis not present

## 2020-08-26 DIAGNOSIS — H6123 Impacted cerumen, bilateral: Secondary | ICD-10-CM | POA: Diagnosis not present

## 2020-09-22 DIAGNOSIS — J029 Acute pharyngitis, unspecified: Secondary | ICD-10-CM | POA: Diagnosis not present

## 2020-09-28 ENCOUNTER — Ambulatory Visit
Admission: EM | Admit: 2020-09-28 | Discharge: 2020-09-28 | Disposition: A | Discharge status: Home / Self Care | Payer: BC Managed Care – PPO

## 2020-09-28 ENCOUNTER — Encounter: Payer: Self-pay | Admitting: Emergency Medicine

## 2020-09-28 ENCOUNTER — Other Ambulatory Visit: Payer: Self-pay

## 2020-09-28 DIAGNOSIS — U071 COVID-19: Secondary | ICD-10-CM

## 2020-09-28 MED ORDER — NIRMATRELVIR/RITONAVIR (PAXLOVID)TABLET: 3.0000 | ORAL_TABLET | Freq: Two times a day (BID) | ORAL | 0 refills | Status: AC

## 2020-09-28 NOTE — Discharge Instructions (Addendum)
Follow up with your Physician for recheck  

## 2020-09-28 NOTE — ED Provider Notes (Signed)
[facility]    CSN: [id number] Arrival date & time: 09/28/20  [id number]      History   Chief Complaint No chief complaint on file.   HPI [name] is a 40 y.o. male.   The history is provided by the patient. No language interpreter was used.  Sore Throat This is a new problem. The current episode started 12 to 24 hours ago. The problem occurs constantly. The problem has been gradually worsening. Nothing aggravates the symptoms. Nothing relieves the symptoms. He has tried nothing for the symptoms. The treatment provided no relief.  Pt had a positive home covid test.  Pt's Md advised him he should take covid drug.  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  11/30/10   [place]   MEDIASTINOTOMY  11/06/2010   Lt anterior  mediastinal mass, rule out lymphoma   PORT-A-CATH REMOVAL  06/22/2011   Procedure: REMOVAL PORT-A-CATH;  Surgeon: Nicanor Alcon, MD;  Location: [facility];  Service: Thoracic;  Laterality: Right;   PORTACATH PLACEMENT  11/2010   TYMPANIC MEMBRANE REPAIR     WISDOM TOOTH EXTRACTION         Home Medications    Prior to Admission medications   Medication Sig Start Date End Date Taking? Authorizing Provider  famotidine (PEPCID) 40 MG tablet Take 40 mg by mouth daily.   Yes [provider]  fexofenadine (ALLEGRA ODT) 30 MG disintegrating tablet Take 30  mg by mouth daily.   Yes [provider]  nirmatrelvir/ritonavir EUA (PAXLOVID) TABS Take 3 tablets by mouth 2 (two) times daily for 5 days. Patient GFR is >60 Take nirmatrelvir (150 mg) two tablets twice daily for 5 days and ritonavir (100 mg) one tablet twice daily for 5 days. 09/28/20 10/03/20 Yes Fransico Meadow, PA-C  dicyclomine (BENTYL) 20 MG tablet Take 1 tablet (20 mg total) by mouth 2 (two) times daily. 07/22/20   Faustino Congress, NP    Family History Family History  Problem Relation Age of Onset   Cancer Maternal Grandmother        sinus cavity   Anesthesia problems Neg Hx     Social History Social History   Tobacco Use   Smoking status: Never   Smokeless tobacco: Never  Substance Use Topics   Alcohol use: Yes    Alcohol/week: 1.0 standard drink    Types: 1 Cans of beer per week    Comment: occasional  5-5 beers in last 4 months   Drug use: No     Allergies   Patient has no known allergies.   Review of Systems Review of Systems  All other systems reviewed and are negative.   Physical Exam Triage Vital Signs ED Triage Vitals  Enc Vitals Group     BP 09/28/20 1011 120/85  Pulse Rate 09/28/20 1011 85     Resp 09/28/20 1011 16     Temp 09/28/20 1011 (!) 97.5 F (36.4 C)     Temp Source 09/28/20 1011 Temporal     SpO2 09/28/20 1011 97 %     Weight --      Height --      Head Circumference --      Peak Flow --      Pain Score 09/28/20 1013 0     Pain Loc --      Pain Edu? --      Excl. in [place]? --    No data found.  Updated Vital Signs BP 120/85 (BP Location: Right Arm)   Pulse 85   Temp (!) 97.5 F (36.4 C) (Temporal)   Resp 16   SpO2 97%   Visual Acuity Right Eye Distance:   Left Eye Distance:   Bilateral Distance:    Right Eye Near:   Left Eye Near:    Bilateral Near:     Physical Exam Vitals and nursing note reviewed.  Constitutional:      Appearance: He is well-developed.  HENT:     Head: Normocephalic and  atraumatic.  Eyes:     Conjunctiva/sclera: Conjunctivae normal.  Cardiovascular:     Rate and Rhythm: Normal rate and regular rhythm.     Heart sounds: No murmur heard. Pulmonary:     Effort: Pulmonary effort is normal. No respiratory distress.     Breath sounds: Normal breath sounds.  Musculoskeletal:        General: Normal range of motion.     Cervical back: Neck supple.  Skin:    General: Skin is warm and dry.  Neurological:     Mental Status: He is alert.     UC Treatments / Results  Labs (all labs ordered are listed, but only abnormal results are displayed) Labs Reviewed - No data to display  EKG   Radiology No results found.  Procedures Procedures (including critical care time)  Medications Ordered in UC Medications - No data to display  Initial Impression / Assessment and Plan / UC Course  I have reviewed the triage vital signs and the nursing notes.  Pertinent labs & imaging results that were available during my care of the patient were reviewed by me and considered in my medical decision making (see chart for details).      Final Clinical Impressions(s) / UC Diagnoses   Final diagnoses:  COVID     Discharge Instructions      Follow up with your Physician for recheck    ED Prescriptions     Medication Sig Dispense Auth. Provider   nirmatrelvir/ritonavir EUA (PAXLOVID) TABS Take 3 tablets by mouth 2 (two) times daily for 5 days. Patient GFR is >60 Take nirmatrelvir (150 mg) two tablets twice daily for 5 days and ritonavir (100 mg) one tablet twice daily for 5 days. 30 tablet Fransico Meadow, [place]      An After Visit Summary was printed and given to the patient.  PDMP not reviewed this encounter.   Fransico Meadow, [place] 09/28/20 1055

## 2020-09-28 NOTE — ED Triage Notes (Signed)
Sore throat today with cough.  At home covid test was positive.

## 2020-10-08 ENCOUNTER — Encounter (HOSPITAL_COMMUNITY): Payer: Self-pay | Admitting: *Deleted

## 2020-10-08 ENCOUNTER — Other Ambulatory Visit: Payer: Self-pay

## 2020-10-08 ENCOUNTER — Emergency Department (HOSPITAL_COMMUNITY)
Admission: EM | Admit: 2020-10-08 | Discharge: 2020-10-08 | Disposition: A | Discharge status: Home / Self Care | Payer: BC Managed Care – PPO | Attending: Emergency Medicine | Admitting: Emergency Medicine

## 2020-10-08 DIAGNOSIS — Z23 Encounter for immunization: Secondary | ICD-10-CM | POA: Diagnosis not present

## 2020-10-08 DIAGNOSIS — Z2914 Encounter for prophylactic rabies immune globin: Secondary | ICD-10-CM | POA: Diagnosis not present

## 2020-10-08 DIAGNOSIS — Z209 Contact with and (suspected) exposure to unspecified communicable disease: Secondary | ICD-10-CM

## 2020-10-08 DIAGNOSIS — Z203 Contact with and (suspected) exposure to rabies: Secondary | ICD-10-CM | POA: Diagnosis not present

## 2020-10-08 MED ORDER — RABIES IMMUNE GLOBULIN 150 UNIT/ML IM INJ
INJECTION | INTRAMUSCULAR | Status: AC
  Filled 2020-10-08: qty 2

## 2020-10-08 MED ORDER — RABIES IMMUNE GLOBULIN 150 UNIT/ML IM INJ
INJECTION | INTRAMUSCULAR | Status: AC
  Filled 2020-10-08: qty 14

## 2020-10-08 MED ORDER — RABIES VACCINE, PCEC IM SUSR
1.0000 mL | Freq: Once | INTRAMUSCULAR | Status: AC
Start: 2020-10-08 — End: 2020-10-08
  Administered 2020-10-08: 1 mL via INTRAMUSCULAR
  Filled 2020-10-08: qty 1

## 2020-10-08 MED ORDER — RABIES IMMUNE GLOBULIN 150 UNIT/ML IM INJ
20.0000 [IU]/kg | INJECTION | Freq: Once | INTRAMUSCULAR | Status: AC
  Administered 2020-10-08: 1950 [IU] via INTRAMUSCULAR
  Filled 2020-10-08: qty 20

## 2020-10-08 NOTE — ED Provider Notes (Signed)
[facility] EMERGENCY DEPARTMENT Provider Note   CSN: [id number] Arrival date & time: 10/08/20  1605     History Chief Complaint  Patient presents with   Rabies Injection    [name] is a 40 y.o. male.  HPI  Patient with no significant medical history presents to the emergency department with chief complaint of exposure to a bat.  Patient states on Saturday he was in his kitchen and noted a bat was flying around, while using a sticky trap tape to a broom he was able to capture the bat and dispose of it.  He denies ever touching the bat, scratches, bites states that he is unsure of how long the bat was living in his home, he has no other complaints this time.  He spoke with animal control who advised him to come to the emergency department for a rabies vaccine.  Patient has no complaints this time, does not endorse headaches, fevers, chills, shortness of breath, chest pain, abdominal pain, nausea, vomiting diarrhea.  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  11/30/10   [place]   MEDIASTINOTOMY  11/06/2010   Lt anterior  mediastinal mass, rule out lymphoma   PORT-A-CATH REMOVAL  06/22/2011   Procedure: REMOVAL PORT-A-CATH;  Surgeon: Nicanor Alcon, MD;  Location: [facility];  Service: Thoracic;  Laterality: Right;   PORTACATH PLACEMENT  11/2010   TYMPANIC MEMBRANE REPAIR     WISDOM TOOTH EXTRACTION         Family History  Problem  Relation Age of Onset   Cancer Maternal Grandmother        sinus cavity   Anesthesia problems Neg Hx     Social History   Tobacco Use   Smoking status: Never   Smokeless tobacco: Never  Substance Use Topics   Alcohol use: Yes    Alcohol/week: 1.0 standard drink    Types: 1 Cans of beer per week    Comment: occasional  5-5 beers in last 4 months   Drug use: No    Home Medications Prior to Admission medications   Medication Sig Start Date End Date Taking? Authorizing Provider  dicyclomine (BENTYL) 20 MG tablet Take 1 tablet (20 mg total) by mouth 2 (two) times daily. 07/22/20   Faustino Congress, NP  famotidine (PEPCID) 40 MG tablet Take 40 mg by mouth daily.    [provider]  fexofenadine (ALLEGRA ODT) 30 MG disintegrating tablet Take 30 mg by mouth daily.    [provider]    Allergies    Patient has no known allergies.  Review of Systems   Review of Systems  Constitutional:  Negative for chills and fever.  HENT:  Negative for congestion.   Respiratory:  Negative for shortness of breath.   Cardiovascular:  Negative for chest pain.  Gastrointestinal:  Negative for abdominal pain.  Genitourinary:  Negative for enuresis.  Musculoskeletal:  Negative for back pain.  Skin:  Negative for rash.  Neurological:  Negative for dizziness.   Physical Exam Updated Vital Signs BP (!) 130/93   Pulse 91   Temp 98.7 F (37.1 C) (Oral)   Resp 16   Ht '6\' 1"'$  (1.854 m)   Wt 98.2 kg   SpO2 95%   BMI 28.56 kg/m   Physical Exam Vitals and nursing note reviewed.  Constitutional:      General: He is not in acute distress.    Appearance: He is not ill-appearing.  HENT:     Head: Normocephalic and atraumatic.     Nose: No congestion.  Eyes:     Conjunctiva/sclera: Conjunctivae normal.  Cardiovascular:     Rate and Rhythm: Normal rate and regular rhythm.  Pulmonary:     Effort: Pulmonary effort is normal.  Skin:    General: Skin is warm and dry.      Comments: Limited skin exam was performed no noted rashes, abrasions or bite marks on patient's upper and/or lower extremities.  Neurological:     Mental Status: He is alert.  Psychiatric:        Mood and Affect: Mood normal.    ED Results / Procedures / Treatments   Labs (all labs ordered are listed, but only abnormal results are displayed) Labs Reviewed - No data to display  EKG None  Radiology No results found.  Procedures Procedures   Medications Ordered in ED Medications  rabies immune globulin (HYPERAB/KEDRAB) injection 1,950 Units (has no administration in time range)  rabies vaccine (RABAVERT) injection 1 mL (has no administration in time range)    ED Course  I have reviewed the triage vital signs and the nursing notes.  Pertinent labs & imaging results that were available during my care of the patient were reviewed by me and considered in my medical decision making (see chart for details).    MDM Rules/Calculators/A&P                          Initial impression-presents with recent bat exposure.  He is alert, does not appear in acute stress, vital signs reassuring.  Due to close proximity to a known rabies vector will recommend prophylactic treatments.  He will receive the immunoglobulin and booster shot today.  Work-up-due to well-appearing patient, benign for exam, further injury not want at this time.  Rule out-low suspicion for systemic infection as patient is nontoxic-appearing, vital signs reassuring.    Plan-   Rabies exposure-patient will receive the immunoglobulin and vaccine today, will have him follow-up as an outpatient for continuing rabies treatment.  Vital signs have remained stable, no indication for hospital admission.  Patient given at home care as well strict return precautions.  Patient verbalized that they understood agreed to said plan.  Final Clinical Impression(s) / ED Diagnoses Final diagnoses:  Exposure to bat without known bite     Rx / DC Orders ED Discharge Orders     None        Marcello Fennel, PA-C 10/08/20 1830    Milton Ferguson, MD 10/12/20 [phone]

## 2020-10-08 NOTE — Discharge Instructions (Addendum)
You received the first rabies vaccine today, I have given you a printout explaining when your next vaccines are due   You may follow-up at this department, the urgent care that I  provided to you, or you can follow-up at an urgent care of your choice please call to make sure that they have the rabies vaccine available.  Come back to the emergency department if you develop chest pain, shortness of breath, severe abdominal pain, uncontrolled nausea, vomiting, diarrhea.

## 2020-10-08 NOTE — ED Triage Notes (Signed)
Pt exposed a bat found in his kitchen.  Unsure for how long bat has been in his house.  Pt here for rabies injections.

## 2020-10-11 ENCOUNTER — Other Ambulatory Visit: Payer: Self-pay

## 2020-10-11 ENCOUNTER — Encounter: Payer: Self-pay | Admitting: Emergency Medicine

## 2020-10-11 ENCOUNTER — Ambulatory Visit: Payer: BC Managed Care – PPO

## 2020-10-11 ENCOUNTER — Ambulatory Visit
Admission: EM | Admit: 2020-10-11 | Discharge: 2020-10-11 | Disposition: A | Discharge status: Home / Self Care | Payer: BC Managed Care – PPO | Attending: Physician Assistant | Admitting: Physician Assistant

## 2020-10-11 DIAGNOSIS — Z203 Contact with and (suspected) exposure to rabies: Secondary | ICD-10-CM

## 2020-10-11 MED ORDER — RABIES VACCINE, PCEC IM SUSR
1.0000 mL | Freq: Once | INTRAMUSCULAR | Status: AC
  Administered 2020-10-11: 1 mL via INTRAMUSCULAR

## 2020-10-11 NOTE — ED Triage Notes (Signed)
Here for second rabies vaccine

## 2020-10-15 DIAGNOSIS — Z203 Contact with and (suspected) exposure to rabies: Secondary | ICD-10-CM | POA: Diagnosis not present

## 2020-10-15 DIAGNOSIS — Z23 Encounter for immunization: Secondary | ICD-10-CM | POA: Diagnosis not present

## 2020-10-22 ENCOUNTER — Ambulatory Visit
Admission: RE | Admit: 2020-10-22 | Discharge: 2020-10-22 | Disposition: A | Discharge status: Home / Self Care | Payer: BC Managed Care – PPO | Source: Ambulatory Visit | Attending: Family Medicine | Admitting: Family Medicine

## 2020-10-22 ENCOUNTER — Other Ambulatory Visit: Payer: Self-pay

## 2020-10-22 DIAGNOSIS — Z203 Contact with and (suspected) exposure to rabies: Secondary | ICD-10-CM

## 2020-10-22 DIAGNOSIS — Z23 Encounter for immunization: Secondary | ICD-10-CM | POA: Diagnosis not present

## 2020-10-22 MED ORDER — RABIES VACCINE, PCEC IM SUSR
1.0000 mL | Freq: Once | INTRAMUSCULAR | Status: AC
  Administered 2020-10-22: 1 mL via INTRAMUSCULAR

## 2020-10-22 NOTE — ED Triage Notes (Signed)
Pt here for final rabies vaccine

## 2020-11-17 DIAGNOSIS — M9903 Segmental and somatic dysfunction of lumbar region: Secondary | ICD-10-CM | POA: Diagnosis not present

## 2020-11-17 DIAGNOSIS — M9902 Segmental and somatic dysfunction of thoracic region: Secondary | ICD-10-CM | POA: Diagnosis not present

## 2020-11-17 DIAGNOSIS — M62830 Muscle spasm of back: Secondary | ICD-10-CM | POA: Diagnosis not present

## 2020-11-17 DIAGNOSIS — M9905 Segmental and somatic dysfunction of pelvic region: Secondary | ICD-10-CM | POA: Diagnosis not present

## 2020-12-09 DIAGNOSIS — J019 Acute sinusitis, unspecified: Secondary | ICD-10-CM | POA: Diagnosis not present

## 2020-12-16 ENCOUNTER — Ambulatory Visit (HOSPITAL_COMMUNITY)
Admission: RE | Admit: 2020-12-16 | Discharge: 2020-12-16 | Disposition: A | Discharge status: Home / Self Care | Payer: BC Managed Care – PPO | Source: Ambulatory Visit | Attending: Family Medicine | Admitting: Family Medicine

## 2020-12-16 ENCOUNTER — Other Ambulatory Visit: Payer: Self-pay

## 2020-12-16 ENCOUNTER — Other Ambulatory Visit (HOSPITAL_COMMUNITY): Payer: Self-pay | Admitting: Family Medicine

## 2020-12-16 DIAGNOSIS — R062 Wheezing: Secondary | ICD-10-CM

## 2020-12-16 DIAGNOSIS — R059 Cough, unspecified: Secondary | ICD-10-CM | POA: Diagnosis not present

## 2020-12-23 DIAGNOSIS — K5792 Diverticulitis of intestine, part unspecified, without perforation or abscess without bleeding: Secondary | ICD-10-CM | POA: Diagnosis not present

## 2020-12-23 DIAGNOSIS — K625 Hemorrhage of anus and rectum: Secondary | ICD-10-CM | POA: Diagnosis not present

## 2020-12-23 DIAGNOSIS — K2980 Duodenitis without bleeding: Secondary | ICD-10-CM | POA: Diagnosis not present

## 2020-12-23 DIAGNOSIS — K219 Gastro-esophageal reflux disease without esophagitis: Secondary | ICD-10-CM | POA: Diagnosis not present

## 2021-02-12 DIAGNOSIS — E782 Mixed hyperlipidemia: Secondary | ICD-10-CM | POA: Diagnosis not present

## 2021-02-18 DIAGNOSIS — Z0001 Encounter for general adult medical examination with abnormal findings: Secondary | ICD-10-CM | POA: Diagnosis not present

## 2021-02-20 ENCOUNTER — Emergency Department (HOSPITAL_COMMUNITY)
Admission: EM | Admit: 2021-02-20 | Discharge: 2021-02-21 | Disposition: A | Discharge status: Home / Self Care | Payer: BC Managed Care – PPO | Attending: Emergency Medicine | Admitting: Emergency Medicine

## 2021-02-20 ENCOUNTER — Other Ambulatory Visit: Payer: Self-pay

## 2021-02-20 ENCOUNTER — Encounter (HOSPITAL_COMMUNITY): Payer: Self-pay | Admitting: Emergency Medicine

## 2021-02-20 DIAGNOSIS — Y92410 Unspecified street and highway as the place of occurrence of the external cause: Secondary | ICD-10-CM | POA: Insufficient documentation

## 2021-02-20 DIAGNOSIS — S199XXA Unspecified injury of neck, initial encounter: Secondary | ICD-10-CM | POA: Diagnosis not present

## 2021-02-20 DIAGNOSIS — S0081XA Abrasion of other part of head, initial encounter: Secondary | ICD-10-CM | POA: Diagnosis not present

## 2021-02-20 DIAGNOSIS — R58 Hemorrhage, not elsewhere classified: Secondary | ICD-10-CM | POA: Diagnosis not present

## 2021-02-20 DIAGNOSIS — S161XXA Strain of muscle, fascia and tendon at neck level, initial encounter: Secondary | ICD-10-CM | POA: Diagnosis not present

## 2021-02-20 DIAGNOSIS — S0990XA Unspecified injury of head, initial encounter: Secondary | ICD-10-CM | POA: Diagnosis not present
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
## 2021-02-21 ENCOUNTER — Emergency Department (HOSPITAL_COMMUNITY): Payer: BC Managed Care – PPO

## 2021-02-21 DIAGNOSIS — S199XXA Unspecified injury of neck, initial encounter: Secondary | ICD-10-CM | POA: Diagnosis not present

## 2021-02-21 DIAGNOSIS — S0990XA Unspecified injury of head, initial encounter: Secondary | ICD-10-CM | POA: Diagnosis not present

## 2021-02-21 MED ORDER — CYCLOBENZAPRINE HCL 10 MG PO TABS: 10.0000 mg | ORAL_TABLET | Freq: Three times a day (TID) | ORAL | 0 refills | Status: AC | PRN

## 2021-02-21 NOTE — Discharge Instructions (Signed)
Rest.  Take ibuprofen 600 mg every 6 hours as needed for pain.  Begin taking Flexeril as prescribed as needed for pain not relieved with ibuprofen.  Return to the emergency department if you develop any new and/or worsening symptoms.
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
## 2021-02-23 DIAGNOSIS — F419 Anxiety disorder, unspecified: Secondary | ICD-10-CM | POA: Diagnosis not present

## 2021-05-04 DIAGNOSIS — T1490XD Injury, unspecified, subsequent encounter: Secondary | ICD-10-CM | POA: Diagnosis not present

## 2021-05-04 DIAGNOSIS — J069 Acute upper respiratory infection, unspecified: Secondary | ICD-10-CM | POA: Diagnosis not present

## 2021-05-25 DIAGNOSIS — H6123 Impacted cerumen, bilateral: Secondary | ICD-10-CM | POA: Diagnosis not present

## 2021-06-08 DIAGNOSIS — R059 Cough, unspecified: Secondary | ICD-10-CM | POA: Diagnosis not present

## 2021-06-08 DIAGNOSIS — J209 Acute bronchitis, unspecified: Secondary | ICD-10-CM | POA: Diagnosis not present

## 2021-08-11 DIAGNOSIS — E782 Mixed hyperlipidemia: Secondary | ICD-10-CM | POA: Diagnosis not present

## 2021-08-11 DIAGNOSIS — E039 Hypothyroidism, unspecified: Secondary | ICD-10-CM | POA: Diagnosis not present

## 2021-08-11 DIAGNOSIS — K7689 Other specified diseases of liver: Secondary | ICD-10-CM | POA: Diagnosis not present

## 2021-08-18 DIAGNOSIS — Z1211 Encounter for screening for malignant neoplasm of colon: Secondary | ICD-10-CM | POA: Diagnosis not present

## 2021-08-18 DIAGNOSIS — K7689 Other specified diseases of liver: Secondary | ICD-10-CM | POA: Diagnosis not present

## 2021-08-18 DIAGNOSIS — R7301 Impaired fasting glucose: Secondary | ICD-10-CM | POA: Diagnosis not present

## 2021-08-18 DIAGNOSIS — E782 Mixed hyperlipidemia: Secondary | ICD-10-CM | POA: Diagnosis not present

## 2021-08-28 DIAGNOSIS — M62830 Muscle spasm of back: Secondary | ICD-10-CM | POA: Diagnosis not present

## 2021-08-28 DIAGNOSIS — M9903 Segmental and somatic dysfunction of lumbar region: Secondary | ICD-10-CM | POA: Diagnosis not present

## 2021-08-28 DIAGNOSIS — M9905 Segmental and somatic dysfunction of pelvic region: Secondary | ICD-10-CM | POA: Diagnosis not present

## 2021-08-28 DIAGNOSIS — M9902 Segmental and somatic dysfunction of thoracic region: Secondary | ICD-10-CM | POA: Diagnosis not present

## 2021-09-07 DIAGNOSIS — M62838 Other muscle spasm: Secondary | ICD-10-CM | POA: Diagnosis not present

## 2021-09-07 DIAGNOSIS — M25551 Pain in right hip: Secondary | ICD-10-CM | POA: Diagnosis not present

## 2021-09-07 DIAGNOSIS — M545 Low back pain, unspecified: Secondary | ICD-10-CM | POA: Diagnosis not present

## 2021-10-19 DIAGNOSIS — M5126 Other intervertebral disc displacement, lumbar region: Secondary | ICD-10-CM | POA: Diagnosis not present

## 2021-10-19 DIAGNOSIS — S62001D Unspecified fracture of navicular [scaphoid] bone of right wrist, subsequent encounter for fracture with routine healing: Secondary | ICD-10-CM | POA: Diagnosis not present

## 2021-10-21 DIAGNOSIS — M545 Low back pain, unspecified: Secondary | ICD-10-CM | POA: Diagnosis not present

## 2021-11-05 DIAGNOSIS — Z6829 Body mass index (BMI) 29.0-29.9, adult: Secondary | ICD-10-CM | POA: Diagnosis not present

## 2021-11-05 DIAGNOSIS — M5136 Other intervertebral disc degeneration, lumbar region: Secondary | ICD-10-CM | POA: Diagnosis not present

## 2021-11-05 DIAGNOSIS — M4696 Unspecified inflammatory spondylopathy, lumbar region: Secondary | ICD-10-CM | POA: Diagnosis not present

## 2021-11-05 DIAGNOSIS — M48061 Spinal stenosis, lumbar region without neurogenic claudication: Secondary | ICD-10-CM | POA: Diagnosis not present

## 2021-11-19 DIAGNOSIS — M5416 Radiculopathy, lumbar region: Secondary | ICD-10-CM | POA: Diagnosis not present

## 2021-12-13 NOTE — [facility] Notes (Unsigned)
Cardiology Office Note  Date: 12/14/2021   ID: [name], DOB 11/28/1980, MRN [id number]  PCP:  Celene Squibb, MD  Cardiologist:  Rozann Lesches, MD Electrophysiologist:  None   Chief Complaint  Patient presents with   Cardiac follow-up    History of Present Illness: [name] is a 41 y.o. male last seen in October 2021 for evaluation of palpitations.  He is here for a follow-up visit.  Reports no interval sense of palpitations, no change in stamina or exertional symptoms.  Still working at [employer].  Echocardiogram in October 2021 revealed LVEF 55 to 60%, normal RV contraction, and mild mitral regurgitation.  He continues to follow-up with Dr. Nevada Crane.  I reviewed his medications which are noted below.  I personally reviewed his ECG today which shows sinus rhythm with increased voltage.  Past Medical History:  Diagnosis Date   Diffuse large B-cell lymphoma (HCC)    Mediastinal mass - CHOP/Rituxan   History of DVT (deep vein thrombosis) 11/2010   Left upper extremity   Pneumonitis    Associated with mediastinal mass    Past Surgical History:  Procedure Laterality Date   Insertion of right IJ Port-A-Cath.  11/30/10   [place]   MEDIASTINOTOMY  11/06/2010   Lt anterior  mediastinal mass, rule out lymphoma   PORT-A-CATH REMOVAL  06/22/2011   Procedure: REMOVAL PORT-A-CATH;  Surgeon: Nicanor Alcon, MD;  Location: [facility];  Service: Thoracic;  Laterality: Right;   PORTACATH PLACEMENT  11/2010   TYMPANIC MEMBRANE REPAIR     WISDOM TOOTH EXTRACTION      Current Outpatient Medications  Medication Sig Dispense Refill   cyclobenzaprine (FLEXERIL) 10 MG tablet Take 1 tablet (10 mg total) by mouth 3 (three) times daily as needed for muscle spasms. 15 tablet 0   famotidine (PEPCID) 40 MG tablet Take 40 mg by mouth as needed for indigestion.     fexofenadine (ALLEGRA ODT) 30 MG disintegrating tablet Take 30 mg by mouth daily.     No current facility-administered  medications for this visit.   Allergies:  Patient has no known allergies.   ROS: No orthopnea or PND.  No syncope.  Physical Exam: VS:  BP 115/80   Pulse 75   Ht '6\' 1"'$  (1.854 m)   Wt 217 lb 9.6 oz (98.7 kg)   SpO2 97%   BMI 28.71 kg/m , BMI Body mass index is 28.71 kg/m.  Wt Readings from Last 3 Encounters:  12/14/21 217 lb 9.6 oz (98.7 kg)  02/20/21 205 lb 0.4 oz (93 kg)  10/08/20 216 lb 8 oz (98.2 kg)    General: Patient appears comfortable at rest. HEENT: Conjunctiva and lids normal. Neck: Supple, no elevated JVP or carotid bruits. Lungs: Clear to auscultation, nonlabored breathing at rest. Cardiac: Regular rate and rhythm, no S3 or significant systolic murmur, no pericardial rub. Extremities: No pitting edema.  ECG:  An ECG dated 12/17/2019 was personally reviewed today and demonstrated:  Sinus tachycardia with borderline increased voltage, decreased R wave progression.  Recent Labwork:  May 2022: Hemoglobin 16.7, platelets 295, BUN 13, creatinine 0.95, potassium 4.2, AST 24, ALT 46  Other Studies Reviewed Today:  Echocardiogram 01/02/2020:  1. Left ventricular ejection fraction, by estimation, is 55 to 60%. The  left ventricle has normal function. The left ventricle has no regional  wall motion abnormalities. Left ventricular diastolic parameters are  indeterminate. Normal global longitudinal   strain of -18.8%.   2.  Right ventricular systolic function is normal. The right ventricular  size is normal. Tricuspid regurgitation signal is inadequate for assessing  PA pressure.   3. The mitral valve is grossly normal. Mild mitral valve regurgitation.   4. The aortic valve is tricuspid. Aortic valve regurgitation is not  visualized.   5. Unable to estimate CVP.   Assessment and Plan:  History of palpitations, resolved without intervention and no significant recurrence under observation.  Echocardiogram from 2021 showed LVEF 55 to 60% with no significant valvular  abnormalities beyond mild mitral regurgitation.  He is clinically stable with no change on examination.  ECG reviewed today.  Would recommend continued follow-up with PCP.  If he starts to develop more consistent palpitations, a ZIO monitor could be considered.  Medication Adjustments/Labs and Tests Ordered: Current medicines are reviewed at length with the patient today.  Concerns regarding medicines are outlined above.   Tests Ordered: Orders Placed This Encounter  Procedures   EKG 12-Lead    Medication Changes: No orders of the defined types were placed in this encounter.   Disposition:  Follow up prn  Signed, Satira Sark, MD, [facility] 12/14/2021 8:31 AM    [facility] [place], [place], [place] [address] Phone: [phone]; Fax: [phone]

## 2021-12-14 ENCOUNTER — Encounter: Payer: Self-pay | Admitting: Cardiology

## 2021-12-14 ENCOUNTER — Ambulatory Visit: Payer: BC Managed Care – PPO | Attending: Cardiology | Admitting: Cardiology

## 2021-12-14 VITALS — BP 115/80 | HR 75 | Ht 73.0 in | Wt 217.6 lb

## 2021-12-14 DIAGNOSIS — R002 Palpitations: Secondary | ICD-10-CM

## 2021-12-14 NOTE — Patient Instructions (Signed)
Medication Instructions:  Your physician recommends that you continue on your current medications as directed. Please refer to the Current Medication list given to you today.   Labwork: none  Testing/Procedures: none  Follow-Up:  Your physician recommends that you schedule a follow-up appointment in: Follow Up as needed  Any Other Special Instructions Will Be Listed Below (If Applicable).  If you need a refill on your cardiac medications before your next appointment, please call your pharmacy.  

## 2021-12-27 DIAGNOSIS — S92505A Nondisplaced unspecified fracture of left lesser toe(s), initial encounter for closed fracture: Secondary | ICD-10-CM | POA: Diagnosis not present

## 2022-01-09 DIAGNOSIS — J0100 Acute maxillary sinusitis, unspecified: Secondary | ICD-10-CM | POA: Diagnosis not present

## 2022-01-09 DIAGNOSIS — R0683 Snoring: Secondary | ICD-10-CM | POA: Diagnosis not present

## 2022-01-11 DIAGNOSIS — M5416 Radiculopathy, lumbar region: Secondary | ICD-10-CM | POA: Diagnosis not present

## 2022-01-27 DIAGNOSIS — M5416 Radiculopathy, lumbar region: Secondary | ICD-10-CM | POA: Diagnosis not present

## 2022-01-30 DIAGNOSIS — G4730 Sleep apnea, unspecified: Secondary | ICD-10-CM | POA: Diagnosis not present

## 2022-02-01 DIAGNOSIS — M5416 Radiculopathy, lumbar region: Secondary | ICD-10-CM | POA: Diagnosis not present

## 2022-02-03 DIAGNOSIS — M5416 Radiculopathy, lumbar region: Secondary | ICD-10-CM | POA: Diagnosis not present

## 2022-02-08 DIAGNOSIS — M5416 Radiculopathy, lumbar region: Secondary | ICD-10-CM | POA: Diagnosis not present

## 2022-02-10 DIAGNOSIS — E782 Mixed hyperlipidemia: Secondary | ICD-10-CM | POA: Diagnosis not present

## 2022-02-10 DIAGNOSIS — M5416 Radiculopathy, lumbar region: Secondary | ICD-10-CM | POA: Diagnosis not present

## 2022-02-10 DIAGNOSIS — R7301 Impaired fasting glucose: Secondary | ICD-10-CM | POA: Diagnosis not present

## 2022-02-15 DIAGNOSIS — M5416 Radiculopathy, lumbar region: Secondary | ICD-10-CM | POA: Diagnosis not present

## 2022-02-17 DIAGNOSIS — Z1211 Encounter for screening for malignant neoplasm of colon: Secondary | ICD-10-CM | POA: Diagnosis not present

## 2022-02-17 DIAGNOSIS — K7689 Other specified diseases of liver: Secondary | ICD-10-CM | POA: Diagnosis not present

## 2022-02-17 DIAGNOSIS — M5416 Radiculopathy, lumbar region: Secondary | ICD-10-CM | POA: Diagnosis not present

## 2022-02-17 DIAGNOSIS — E782 Mixed hyperlipidemia: Secondary | ICD-10-CM | POA: Diagnosis not present

## 2022-02-17 DIAGNOSIS — R7301 Impaired fasting glucose: Secondary | ICD-10-CM | POA: Diagnosis not present

## 2022-02-22 DIAGNOSIS — M5416 Radiculopathy, lumbar region: Secondary | ICD-10-CM | POA: Diagnosis not present

## 2022-03-02 DIAGNOSIS — G4733 Obstructive sleep apnea (adult) (pediatric): Secondary | ICD-10-CM | POA: Diagnosis not present
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
